# Patient Record
Sex: Male | Born: 1965 | Race: White | Hispanic: No | Marital: Married | Smoking: Former smoker
Health system: Southern US, Community
[De-identification: ages and names within clinical notes are randomized; demographics above are authoritative.]

## PROBLEM LIST (undated history)

## (undated) DIAGNOSIS — K219 Gastro-esophageal reflux disease without esophagitis: Secondary | ICD-10-CM

## (undated) HISTORY — PX: NECK SURGERY: SHX720

---

## 1999-04-30 ENCOUNTER — Emergency Department (HOSPITAL_COMMUNITY): Admission: EM | Admit: 1999-04-30 | Discharge: 1999-04-30 | Payer: Self-pay | Admitting: Emergency Medicine

## 1999-05-05 ENCOUNTER — Emergency Department (HOSPITAL_COMMUNITY): Admission: EM | Admit: 1999-05-05 | Discharge: 1999-05-05 | Payer: Self-pay | Admitting: Emergency Medicine

## 1999-05-10 ENCOUNTER — Encounter: Admission: RE | Admit: 1999-05-10 | Discharge: 1999-05-10 | Payer: Self-pay | Admitting: *Deleted

## 1999-06-17 ENCOUNTER — Emergency Department (HOSPITAL_COMMUNITY): Admission: EM | Admit: 1999-06-17 | Discharge: 1999-06-17 | Payer: Self-pay | Admitting: Emergency Medicine

## 1999-06-17 ENCOUNTER — Encounter: Payer: Self-pay | Admitting: Emergency Medicine

## 2005-07-06 ENCOUNTER — Ambulatory Visit: Payer: Self-pay | Admitting: Internal Medicine

## 2005-07-20 ENCOUNTER — Observation Stay (HOSPITAL_COMMUNITY): Admission: RE | Admit: 2005-07-20 | Discharge: 2005-07-21 | Payer: Self-pay | Admitting: Neurosurgery

## 2005-10-02 ENCOUNTER — Encounter: Admission: RE | Admit: 2005-10-02 | Discharge: 2005-10-02 | Payer: Self-pay | Admitting: Neurosurgery

## 2006-06-23 ENCOUNTER — Emergency Department (HOSPITAL_COMMUNITY): Admission: EM | Admit: 2006-06-23 | Discharge: 2006-06-23 | Payer: Self-pay | Admitting: Emergency Medicine

## 2015-01-04 ENCOUNTER — Encounter (HOSPITAL_BASED_OUTPATIENT_CLINIC_OR_DEPARTMENT_OTHER): Payer: Self-pay | Admitting: *Deleted

## 2015-01-04 ENCOUNTER — Emergency Department (HOSPITAL_BASED_OUTPATIENT_CLINIC_OR_DEPARTMENT_OTHER)
Admission: EM | Admit: 2015-01-04 | Discharge: 2015-01-04 | Disposition: A | Discharge status: Home / Self Care | Payer: Self-pay | Attending: Emergency Medicine | Admitting: Emergency Medicine

## 2015-01-04 DIAGNOSIS — Z202 Contact with and (suspected) exposure to infections with a predominantly sexual mode of transmission: Secondary | ICD-10-CM | POA: Insufficient documentation

## 2015-01-04 DIAGNOSIS — Z87891 Personal history of nicotine dependence: Secondary | ICD-10-CM | POA: Insufficient documentation

## 2015-01-04 LAB — URINALYSIS, ROUTINE W REFLEX MICROSCOPIC
Bilirubin Urine: NEGATIVE
Glucose, UA: NEGATIVE mg/dL
Hgb urine dipstick: NEGATIVE
Ketones, ur: NEGATIVE mg/dL
Leukocytes, UA: NEGATIVE
Nitrite: NEGATIVE
Protein, ur: NEGATIVE mg/dL
Specific Gravity, Urine: 1.024 (ref 1.005–1.030)
Urobilinogen, UA: 1 mg/dL (ref 0.0–1.0)
pH: 6.5 (ref 5.0–8.0)

## 2015-01-04 MED ORDER — CEFTRIAXONE SODIUM 250 MG IJ SOLR
250.0000 mg | Freq: Once | INTRAMUSCULAR | Status: AC
  Administered 2015-01-04: 250 mg via INTRAMUSCULAR
  Filled 2015-01-04: qty 250

## 2015-01-04 MED ORDER — AZITHROMYCIN 250 MG PO TABS
1000.0000 mg | ORAL_TABLET | Freq: Once | ORAL | Status: AC
  Administered 2015-01-04: 1000 mg via ORAL
  Filled 2015-01-04: qty 4

## 2015-01-04 MED ORDER — LIDOCAINE HCL (PF) 1 % IJ SOLN
INTRAMUSCULAR | Status: AC
  Filled 2015-01-04: qty 5

## 2015-01-04 NOTE — Discharge Instructions (Signed)
Please read and follow all provided instructions.  Your diagnoses today include:  1. Exposure to STD     Tests performed today include:  Test for gonorrhea, chlamydia, HIV, and syphilis. You will be notified by telephone if you have a positive result.  Vital signs. See below for your results today.   Medications:  You were treated for chlamydia (1 gram azithromycin pills) and gonorrhea (250mg  rocephin shot).  Home care instructions:  Read educational materials contained in this packet and follow any instructions provided.   You should tell your partners about your infection and avoid having sex for one week to allow time for the medicine to work.  STD Testing:  [facility] [place] [place]  [address], [place], phone [phone] or [phone]    Monday - Friday, call for an appointment  [facility] [place] [place] [facility]  [address], [place], phone [phone] or [phone]   Monday - Friday, call for an appointment  Return instructions:   Please return to the Emergency Department if you experience worsening symptoms.   Please return if you have any other emergent concerns.  Additional Information:  Your vital signs today were: BP 119/78 mmHg   Pulse 75   Temp(Src) 98.2 F (36.8 C) (Oral)   Resp 18   Ht 5\' 10"  (1.778 m)   Wt 210 lb (95.255 kg)   BMI 30.13 kg/m2   SpO2 99% If your blood pressure (BP) was elevated above 135/85 this visit, please have this repeated by your doctor within one month. --------------

## 2015-01-04 NOTE — ED Provider Notes (Signed)
CSN: [id number]     Arrival date & time 01/04/15  1937 History   First MD Initiated Contact with Patient 01/04/15 2142     Chief Complaint  Patient presents with  . Exposure to STD     (Consider location/radiation/quality/duration/timing/severity/associated sxs/prior Treatment) HPI Comments: Patient presents with complaint of exposure to sexually transmitted infection. Patient states that his wife was told that she tested positive for sexual transmitted infection today. He is uncertain as to the infection that she has. She was treated with pills. He denies any symptoms including penile discharge, dysuria, sores. He is here for testing and treatment. No other complaints. Patient has not had any other sexual partners in 4 years.   The history is provided by the patient.    History reviewed. No pertinent past medical history. Past Surgical History  Procedure Laterality Date  . Neck surgery     No family history on file. History  Substance Use Topics  . Smoking status: Former Games developer  . Smokeless tobacco: Not on file  . Alcohol Use: No    Review of Systems  Constitutional: Negative for fever.  HENT: Negative for sore throat.   Eyes: Negative for discharge.  Gastrointestinal: Negative for rectal pain.  Genitourinary: Negative for dysuria, frequency, discharge, genital sores, penile pain and testicular pain.  Musculoskeletal: Negative for arthralgias.  Skin: Negative for rash.  Hematological: Negative for adenopathy.      Allergies  Review of patient's allergies indicates no known allergies.  Home Medications   Prior to Admission medications   Not on File   BP 119/78 mmHg  Pulse 75  Temp(Src) 98.2 F (36.8 C) (Oral)  Resp 18  Ht  (1.778 m)  Wt 210 lb (95.255 kg)  BMI 30.13 kg/m2  SpO2 99% Physical Exam  Constitutional: He appears well-developed and well-nourished.  HENT:  Head: Normocephalic and atraumatic.  Eyes: Conjunctivae are normal.  Neck: Normal  range of motion. Neck supple.  Pulmonary/Chest: No respiratory distress.  Genitourinary:    Right testis shows no mass, no swelling and no tenderness. Left testis shows no mass, no swelling and no tenderness. Uncircumcised.  Lymphadenopathy:       Right: No inguinal adenopathy present.       Left: No inguinal adenopathy present.  Neurological: He is alert.  Skin: Skin is warm and dry.  Psychiatric: He has a normal mood and affect.  Nursing note and vitals reviewed.   ED Course  Procedures (including critical care time) Labs Review Labs Reviewed  URINALYSIS, ROUTINE W REFLEX MICROSCOPIC  RPR  HIV ANTIBODY (ROUTINE TESTING)  GC/CHLAMYDIA PROBE AMP ([facility])    Imaging Review No results found.   EKG Interpretation None      10:54 PM Patient seen and examined. Medications ordered.   Vital signs reviewed and are as follows: BP 119/78 mmHg  Pulse 75  Temp(Src) 98.2 F (36.8 C) (Oral)  Resp 18  Ht  (1.778 m)  Wt 210 lb (95.255 kg)  BMI 30.13 kg/m2  SpO2 99%   Pt seen and examined. Will test and treat for STD exposure. Patient counseled on safe sexual practices. Told them that they should not have sexual contact for next 7 days and that they need to inform sexual partners so that they can get tested and treated as well. Patient verbalizes understanding and agrees with plan.      MDM   Final diagnoses:  Exposure to STD   Tested and treated. UA nml.  Renne CriglerJoshua Morrill Bomkamp, PA-C 01/04/15 2312  Arby BarretteMarcy Pfeiffer, MD 01/10/15 [phone]

## 2015-01-04 NOTE — ED Notes (Signed)
States his wife was treated for an STD last week and he is here to get treatment also.

## 2015-01-06 LAB — RPR: RPR Ser Ql: NONREACTIVE

## 2015-01-06 LAB — HIV ANTIBODY (ROUTINE TESTING W REFLEX): HIV SCREEN 4TH GENERATION: NONREACTIVE

## 2016-03-31 ENCOUNTER — Observation Stay (HOSPITAL_COMMUNITY): Payer: Self-pay | Admitting: Registered Nurse

## 2016-03-31 ENCOUNTER — Encounter (HOSPITAL_BASED_OUTPATIENT_CLINIC_OR_DEPARTMENT_OTHER): Payer: Self-pay | Admitting: *Deleted

## 2016-03-31 ENCOUNTER — Observation Stay (HOSPITAL_COMMUNITY): Payer: Self-pay

## 2016-03-31 ENCOUNTER — Encounter (HOSPITAL_COMMUNITY)
Admission: EM | Disposition: A | Discharge status: Home / Self Care | Payer: Self-pay | Source: Home / Self Care | Attending: Emergency Medicine

## 2016-03-31 ENCOUNTER — Observation Stay (HOSPITAL_BASED_OUTPATIENT_CLINIC_OR_DEPARTMENT_OTHER)
Admission: EM | Admit: 2016-03-31 | Discharge: 2016-04-02 | Disposition: A | Discharge status: Home / Self Care | Payer: Self-pay | Attending: General Surgery | Admitting: General Surgery

## 2016-03-31 DIAGNOSIS — L02416 Cutaneous abscess of left lower limb: Principal | ICD-10-CM | POA: Insufficient documentation

## 2016-03-31 DIAGNOSIS — B356 Tinea cruris: Secondary | ICD-10-CM | POA: Insufficient documentation

## 2016-03-31 DIAGNOSIS — L0291 Cutaneous abscess, unspecified: Secondary | ICD-10-CM

## 2016-03-31 DIAGNOSIS — L03116 Cellulitis of left lower limb: Secondary | ICD-10-CM | POA: Insufficient documentation

## 2016-03-31 DIAGNOSIS — L02214 Cutaneous abscess of groin: Secondary | ICD-10-CM | POA: Diagnosis present

## 2016-03-31 DIAGNOSIS — L0231 Cutaneous abscess of buttock: Secondary | ICD-10-CM

## 2016-03-31 DIAGNOSIS — Z6831 Body mass index (BMI) 31.0-31.9, adult: Secondary | ICD-10-CM | POA: Insufficient documentation

## 2016-03-31 DIAGNOSIS — E669 Obesity, unspecified: Secondary | ICD-10-CM | POA: Insufficient documentation

## 2016-03-31 DIAGNOSIS — F17290 Nicotine dependence, other tobacco product, uncomplicated: Secondary | ICD-10-CM | POA: Insufficient documentation

## 2016-03-31 HISTORY — PX: INCISION AND DRAINAGE ABSCESS: SHX5864

## 2016-03-31 LAB — CBC WITH DIFFERENTIAL/PLATELET
BASOS PCT: 0 %
BLASTS: 0 %
Band Neutrophils: 0 %
Basophils Absolute: 0 10*3/uL (ref 0.0–0.1)
Eosinophils Absolute: 0.1 10*3/uL (ref 0.0–0.7)
Eosinophils Relative: 1 %
HEMATOCRIT: 43.1 % (ref 39.0–52.0)
HEMOGLOBIN: 14.5 g/dL (ref 13.0–17.0)
Lymphocytes Relative: 34 %
Lymphs Abs: 3.9 10*3/uL (ref 0.7–4.0)
MCH: 29.5 pg (ref 26.0–34.0)
MCHC: 33.6 g/dL (ref 30.0–36.0)
MCV: 87.8 fL (ref 78.0–100.0)
Metamyelocytes Relative: 0 %
Monocytes Absolute: 0.9 10*3/uL (ref 0.1–1.0)
Monocytes Relative: 8 %
Myelocytes: 0 %
NEUTROS PCT: 57 %
NRBC: 0 /100{WBCs}
Neutro Abs: 6.7 10*3/uL (ref 1.7–7.7)
OTHER: 0 %
PLATELETS: 486 10*3/uL — AB (ref 150–400)
PROMYELOCYTES ABS: 0 %
RBC: 4.91 MIL/uL (ref 4.22–5.81)
RDW: 14.5 % (ref 11.5–15.5)
WBC: 11.6 10*3/uL — ABNORMAL HIGH (ref 4.0–10.5)

## 2016-03-31 LAB — BASIC METABOLIC PANEL
Anion gap: 5 (ref 5–15)
BUN: 15 mg/dL (ref 6–20)
CHLORIDE: 106 mmol/L (ref 101–111)
CO2: 26 mmol/L (ref 22–32)
Calcium: 8.5 mg/dL — ABNORMAL LOW (ref 8.9–10.3)
Creatinine, Ser: 1.13 mg/dL (ref 0.61–1.24)
GFR calc Af Amer: >60 mL/min (ref >60)
Glucose, Bld: 74 mg/dL (ref 65–99)
Potassium: 3.8 mmol/L (ref 3.5–5.1)
SODIUM: 137 mmol/L (ref 135–145)

## 2016-03-31 LAB — I-STAT CG4 LACTIC ACID, ED: LACTIC ACID, VENOUS: 1.34 mmol/L (ref 0.5–2.0)

## 2016-03-31 LAB — PROTIME-INR
INR: 0.99 (ref 0.00–1.49)
PROTHROMBIN TIME: 13.3 s (ref 11.6–15.2)

## 2016-03-31 LAB — MRSA PCR SCREENING: MRSA BY PCR: POSITIVE — AB

## 2016-03-31 SURGERY — INCISION AND DRAINAGE, ABSCESS
Anesthesia: General | Site: Thigh | Laterality: Left

## 2016-03-31 MED ORDER — HYDROMORPHONE HCL 1 MG/ML IJ SOLN: 1.0000 mg | INTRAMUSCULAR | Status: DC | PRN

## 2016-03-31 MED ORDER — FENTANYL CITRATE (PF) 100 MCG/2ML IJ SOLN
INTRAMUSCULAR | Status: AC
  Filled 2016-03-31: qty 2

## 2016-03-31 MED ORDER — FENTANYL CITRATE (PF) 100 MCG/2ML IJ SOLN
INTRAMUSCULAR | Status: DC | PRN
  Administered 2016-03-31 (×4): 50 ug via INTRAVENOUS

## 2016-03-31 MED ORDER — MIDAZOLAM HCL 5 MG/5ML IJ SOLN
INTRAMUSCULAR | Status: DC | PRN
  Administered 2016-03-31: 2 mg via INTRAVENOUS

## 2016-03-31 MED ORDER — PIPERACILLIN-TAZOBACTAM 3.375 G IVPB
3.3750 g | Freq: Three times a day (TID) | INTRAVENOUS | Status: DC
  Administered 2016-03-31 – 2016-04-02 (×6): 3.375 g via INTRAVENOUS
  Filled 2016-03-31 (×7): qty 50

## 2016-03-31 MED ORDER — LIDOCAINE HCL (CARDIAC) 20 MG/ML IV SOLN
INTRAVENOUS | Status: DC | PRN
  Administered 2016-03-31: 30 mg via INTRAVENOUS

## 2016-03-31 MED ORDER — ACETAMINOPHEN 650 MG RE SUPP: 650.0000 mg | Freq: Four times a day (QID) | RECTAL | Status: DC | PRN

## 2016-03-31 MED ORDER — IOPAMIDOL (ISOVUE-300) INJECTION 61%
100.0000 mL | Freq: Once | INTRAVENOUS | Status: AC | PRN
  Administered 2016-03-31: 100 mL via INTRAVENOUS

## 2016-03-31 MED ORDER — OXYCODONE HCL 5 MG PO TABS: 5.0000 mg | ORAL_TABLET | Freq: Once | ORAL | Status: DC | PRN

## 2016-03-31 MED ORDER — ONDANSETRON HCL 4 MG/2ML IJ SOLN
INTRAMUSCULAR | Status: DC | PRN
  Administered 2016-03-31: 4 mg via INTRAVENOUS

## 2016-03-31 MED ORDER — MIDAZOLAM HCL 2 MG/2ML IJ SOLN
INTRAMUSCULAR | Status: AC
  Filled 2016-03-31: qty 2

## 2016-03-31 MED ORDER — BUPIVACAINE-EPINEPHRINE (PF) 0.25% -1:200000 IJ SOLN
INTRAMUSCULAR | Status: AC
  Filled 2016-03-31: qty 30

## 2016-03-31 MED ORDER — MORPHINE SULFATE (PF) 2 MG/ML IV SOLN
1.0000 mg | INTRAVENOUS | Status: DC | PRN
  Administered 2016-04-01: 2 mg via INTRAVENOUS
  Filled 2016-03-31: qty 1

## 2016-03-31 MED ORDER — DEXAMETHASONE SODIUM PHOSPHATE 10 MG/ML IJ SOLN
INTRAMUSCULAR | Status: DC | PRN
  Administered 2016-03-31: 10 mg via INTRAVENOUS

## 2016-03-31 MED ORDER — LIDOCAINE HCL (CARDIAC) 20 MG/ML IV SOLN
INTRAVENOUS | Status: AC
  Filled 2016-03-31: qty 5

## 2016-03-31 MED ORDER — DEXAMETHASONE SODIUM PHOSPHATE 10 MG/ML IJ SOLN
INTRAMUSCULAR | Status: AC
  Filled 2016-03-31: qty 1

## 2016-03-31 MED ORDER — ONDANSETRON 4 MG PO TBDP: 4.0000 mg | ORAL_TABLET | Freq: Four times a day (QID) | ORAL | Status: DC | PRN

## 2016-03-31 MED ORDER — ACETAMINOPHEN 325 MG PO TABS: 650.0000 mg | ORAL_TABLET | Freq: Four times a day (QID) | ORAL | Status: DC | PRN

## 2016-03-31 MED ORDER — ONDANSETRON HCL 4 MG/2ML IJ SOLN
INTRAMUSCULAR | Status: AC
  Filled 2016-03-31: qty 2

## 2016-03-31 MED ORDER — VANCOMYCIN HCL IN DEXTROSE 1-5 GM/200ML-% IV SOLN
1000.0000 mg | Freq: Three times a day (TID) | INTRAVENOUS | Status: DC
  Administered 2016-03-31 – 2016-04-02 (×6): 1000 mg via INTRAVENOUS
  Filled 2016-03-31 (×7): qty 200

## 2016-03-31 MED ORDER — SODIUM CHLORIDE 0.9 % IV SOLN
INTRAVENOUS | Status: DC
  Administered 2016-03-31 – 2016-04-01 (×2): via INTRAVENOUS

## 2016-03-31 MED ORDER — PROPOFOL 10 MG/ML IV BOLUS
INTRAVENOUS | Status: DC | PRN
  Administered 2016-03-31: 180 mg via INTRAVENOUS

## 2016-03-31 MED ORDER — HYDROMORPHONE HCL 1 MG/ML IJ SOLN
INTRAMUSCULAR | Status: AC
  Filled 2016-03-31: qty 1

## 2016-03-31 MED ORDER — HYDROMORPHONE HCL 1 MG/ML IJ SOLN
0.2500 mg | INTRAMUSCULAR | Status: DC | PRN
  Administered 2016-03-31 (×4): 0.5 mg via INTRAVENOUS

## 2016-03-31 MED ORDER — ENOXAPARIN SODIUM 40 MG/0.4ML ~~LOC~~ SOLN
40.0000 mg | SUBCUTANEOUS | Status: DC
  Administered 2016-04-01: 40 mg via SUBCUTANEOUS
  Filled 2016-03-31 (×3): qty 0.4

## 2016-03-31 MED ORDER — OXYCODONE-ACETAMINOPHEN 5-325 MG PO TABS
1.0000 | ORAL_TABLET | ORAL | Status: DC | PRN
Start: 2016-03-31 — End: 2016-04-02

## 2016-03-31 MED ORDER — ONDANSETRON HCL 4 MG/2ML IJ SOLN: 4.0000 mg | Freq: Four times a day (QID) | INTRAMUSCULAR | Status: DC | PRN

## 2016-03-31 MED ORDER — OXYCODONE HCL 5 MG/5ML PO SOLN: 5.0000 mg | Freq: Once | ORAL | Status: DC | PRN

## 2016-03-31 MED ORDER — PROPOFOL 10 MG/ML IV BOLUS
INTRAVENOUS | Status: AC
  Filled 2016-03-31: qty 20

## 2016-03-31 MED ORDER — 0.9 % SODIUM CHLORIDE (POUR BTL) OPTIME
TOPICAL | Status: DC | PRN
  Administered 2016-03-31: 1000 mL

## 2016-03-31 SURGICAL SUPPLY — 33 items
BLADE SURG 15 STRL LF DISP TIS (BLADE) ×1 IMPLANT
BLADE SURG 15 STRL SS (BLADE) ×3
BNDG GAUZE ELAST 4 BULKY (GAUZE/BANDAGES/DRESSINGS) ×4 IMPLANT
COVER SURGICAL LIGHT HANDLE (MISCELLANEOUS) ×6 IMPLANT
DECANTER SPIKE VIAL GLASS SM (MISCELLANEOUS) IMPLANT
DRAPE LAPAROSCOPIC ABDOMINAL (DRAPES) IMPLANT
DRSG PAD ABDOMINAL 8X10 ST (GAUZE/BANDAGES/DRESSINGS) ×4 IMPLANT
ELECT PENCIL ROCKER SW 15FT (MISCELLANEOUS) ×3 IMPLANT
ELECT REM PT RETURN 9FT ADLT (ELECTROSURGICAL) ×3
ELECTRODE REM PT RTRN 9FT ADLT (ELECTROSURGICAL) ×1 IMPLANT
GAUZE SPONGE 4X4 12PLY STRL (GAUZE/BANDAGES/DRESSINGS) ×2 IMPLANT
GLOVE BIO SURGEON STRL SZ7.5 (GLOVE) ×3 IMPLANT
GLOVE BIOGEL PI IND STRL 7.0 (GLOVE) IMPLANT
GLOVE BIOGEL PI IND STRL 7.5 (GLOVE) IMPLANT
GLOVE BIOGEL PI INDICATOR 7.0 (GLOVE) ×2
GLOVE BIOGEL PI INDICATOR 7.5 (GLOVE) ×2
GLOVE SURG SS PI 7.0 STRL IVOR (GLOVE) ×2 IMPLANT
GOWN STRL REUS W/TWL LRG LVL3 (GOWN DISPOSABLE) ×6 IMPLANT
KIT BASIN OR (CUSTOM PROCEDURE TRAY) ×3 IMPLANT
NDL HYPO 25X1 1.5 SAFETY (NEEDLE) IMPLANT
NEEDLE HYPO 25X1 1.5 SAFETY (NEEDLE) IMPLANT
NS IRRIG 1000ML POUR BTL (IV SOLUTION) ×3 IMPLANT
SPONGE LAP 18X18 X RAY DECT (DISPOSABLE) ×1 IMPLANT
SUT MNCRL AB 4-0 PS2 18 (SUTURE) IMPLANT
SUT VIC AB 3-0 SH 27 (SUTURE)
SUT VIC AB 3-0 SH 27XBRD (SUTURE) IMPLANT
SWAB COLLECTION DEVICE MRSA (MISCELLANEOUS) ×2 IMPLANT
SWAB CULTURE ESWAB REG 1ML (MISCELLANEOUS) ×2 IMPLANT
SYR BULB 3OZ (MISCELLANEOUS) ×1 IMPLANT
SYR CONTROL 10ML LL (SYRINGE) IMPLANT
TAPE CLOTH SURG 4X10 WHT LF (GAUZE/BANDAGES/DRESSINGS) ×2 IMPLANT
TOWEL OR 17X26 10 PK STRL BLUE (TOWEL DISPOSABLE) ×3 IMPLANT
YANKAUER SUCT BULB TIP NO VENT (SUCTIONS) ×3 IMPLANT

## 2016-03-31 NOTE — Progress Notes (Signed)
Pharmacy Antibiotic Note  Silvestre GunnerDouglas L Schillo is a 50 y.o. male admitted on 03/31/2016 with cellulitis/abscess of groin region for past week that has worsened.  Pharmacy has been consulted for Vancomycin dosing.  03/31/2016:   Afebrile  Mild leukocytosis; LA wnl  Renal fxn at baseline.  NCrCl~280ml/min.  Plan:  Vancomycin 1 IV every 8 hours.  Goal trough 15-20 mcg/mL.   Check Vancomycin trough at steady state  Zosyn per MD  Monitor renal function and cx data   Height: 5\' 9"  (175.3 cm) Weight: 210 lb (95.255 kg) IBW/kg (Calculated) : 70.7  Temp (24hrs), Avg:98.1 F (36.7 C), Min:97.9 F (36.6 C), Max:98.4 F (36.9 C)   Recent Labs Lab 03/31/16 1220 03/31/16 1222  WBC 11.6*  --   CREATININE 1.13  --   LATICACIDVEN  --  1.34    Estimated Creatinine Clearance: 90 mL/min (by C-G formula based on Cr of 1.13).    No Known Allergies  Antimicrobials this admission: Vancomycin 4/28>> Zosyn 4/28 >>   Dose adjustments this admission:  Microbiology results: 4/28 BCx: sent 4/28 Wound: sent  Thank you for allowing pharmacy to be a part of this patient's care.  Junita PushMichelle Tammi Boulier, PharmD, BCPS Pager: [phone] 03/31/2016 2:13 PM

## 2016-03-31 NOTE — Anesthesia Preprocedure Evaluation (Signed)
Anesthesia Evaluation  Patient identified by MRN, date of birth, ID band Patient awake    Reviewed: Allergy & Precautions, NPO status , Patient's Chart, lab work & pertinent test results  Airway Mallampati: II   Neck ROM: full    Dental   Pulmonary former smoker,    breath sounds clear to auscultation       Cardiovascular negative cardio ROS   Rhythm:regular Rate:Normal     Neuro/Psych    GI/Hepatic   Endo/Other  obese  Renal/GU      Musculoskeletal   Abdominal   Peds  Hematology   Anesthesia Other Findings   Reproductive/Obstetrics                             Anesthesia Physical Anesthesia Plan  ASA: II  Anesthesia Plan: General   Post-op Pain Management:    Induction: Intravenous  Airway Management Planned: LMA  Additional Equipment:   Intra-op Plan:   Post-operative Plan:   Informed Consent: I have reviewed the patients History and Physical, chart, labs and discussed the procedure including the risks, benefits and alternatives for the proposed anesthesia with the patient or authorized representative who has indicated his/her understanding and acceptance.     Plan Discussed with: CRNA, Anesthesiologist and Surgeon  Anesthesia Plan Comments:         Anesthesia Quick Evaluation

## 2016-03-31 NOTE — Anesthesia Procedure Notes (Signed)
Procedure Name: LMA Insertion Date/Time: 03/31/2016 7:46 PM Performed by: Early OsmondEARGLE, Kaulana Brindle E Pre-anesthesia Checklist: Patient identified, Emergency Drugs available, Suction available and Patient being monitored Patient Re-evaluated:Patient Re-evaluated prior to inductionOxygen Delivery Method: Circle system utilized Preoxygenation: Pre-oxygenation with 100% oxygen Intubation Type: IV induction Ventilation: Mask ventilation without difficulty LMA: LMA inserted LMA Size: 5.0 Number of attempts: 1 Placement Confirmation: positive ETCO2 and breath sounds checked- equal and bilateral Tube secured with: Tape Dental Injury: Teeth and Oropharynx as per pre-operative assessment

## 2016-03-31 NOTE — Transfer of Care (Signed)
Immediate Anesthesia Transfer of Care Note  Patient: Alan Goodman  Procedure(s) Performed: Procedure(s): INCISION AND DRAINAGE OF LEFT GROIN ABSCESS (Left)  Patient Location: PACU  Anesthesia Type:General  Level of Consciousness:  sedated, patient cooperative and responds to stimulation  Airway & Oxygen Therapy:Patient Spontanous Breathing and Patient connected to face mask oxgen  Post-op Assessment:  Report given to PACU RN and Post -op Vital signs reviewed and stable  Post vital signs:  Reviewed and stable  Last Vitals:  Filed Vitals:   03/31/16 1515 03/31/16 1641  BP: 106/56 110/62  Pulse: 68 72  Temp: 36.7 C 36.7 C  Resp: 16 20    Complications: No apparent anesthesia complications

## 2016-03-31 NOTE — Progress Notes (Signed)
  Left groin/thigh abscess, 6cm.   Recommend OR for I&D this evening.  Surgical risks discussed with the patient including infection, bleeding, anesthesia risks.  He verbalizes understanding and wishes to proceed.  Geneviene Tesch, ANP-BC

## 2016-03-31 NOTE — H&P (Signed)
Chief Complaint: left thigh abscess and malaise HPI: Alan Goodman is a 50 year old male who presents from [facility] with a left groin abscess.  He reports developing malaise and generalized fatigue about 1 week ago and thereafter groin pain.  About 3 days ago, he noticed purulent drainage which spontaneously started draining.  He has not been able to inspect the area and doesn't know if it has changed or not.  Denies previous symptoms.  He is a former smoker, but still "vapes."  Denies diabetes.  His work up shows, WBC 11.6.  He was unable to get a CT scan at [facility] as it was not working and therefore referred to [facility].   History reviewed. No pertinent past medical history.  Past Surgical History  Procedure Laterality Date  . Neck surgery      History reviewed. No pertinent family history. Social History:  reports that he has quit smoking. He does not have any smokeless tobacco history on file. He reports that he does not drink alcohol or use illicit drugs.  Allergies: No Known Allergies   (Not in a hospital admission)  Results for orders placed or performed during the hospital encounter of 03/31/16 (from the past 48 hour(s))  CBC with Differential/Platelet     Status: Abnormal   Collection Time: 03/31/16 12:20 PM  Result Value Ref Range   WBC 11.6 (H) 4.0 - 10.5 K/uL   RBC 4.91 4.22 - 5.81 MIL/uL   Hemoglobin 14.5 13.0 - 17.0 g/dL   HCT 43.1 39.0 - 52.0 %   MCV 87.8 78.0 - 100.0 fL   MCH 29.5 26.0 - 34.0 pg   MCHC 33.6 30.0 - 36.0 g/dL   RDW 14.5 11.5 - 15.5 %   Platelets 486 (H) 150 - 400 K/uL   Neutrophils Relative % 57 %   Lymphocytes Relative 34 %   Monocytes Relative 8 %   Eosinophils Relative 1 %   Basophils Relative 0 %   Band Neutrophils 0 %   Metamyelocytes Relative 0 %   Myelocytes 0 %   Promyelocytes Absolute 0 %   Blasts 0 %   nRBC 0 0 /100 WBC   Other 0 %   Neutro Abs 6.7 1.7 - 7.7 K/uL   Lymphs Abs 3.9 0.7 - 4.0 K/uL   Monocytes Absolute 0.9 0.1 - 1.0 K/uL   Eosinophils Absolute 0.1 0.0 - 0.7 K/uL   Basophils Absolute 0.0 0.0 - 0.1 K/uL   WBC Morphology TOXIC GRANULATION     Comment: ATYPICAL LYMPHOCYTES  Basic metabolic panel     Status: Abnormal   Collection Time: 03/31/16 12:20 PM  Result Value Ref Range   Sodium 137 135 - 145 mmol/L   Potassium 3.8 3.5 - 5.1 mmol/L   Chloride 106 101 - 111 mmol/L   CO2 26 22 - 32 mmol/L   Glucose, Bld 74 65 - 99 mg/dL   BUN 15 6 - 20 mg/dL   Creatinine, Ser 1.13 0.61 - 1.24 mg/dL   Calcium 8.5 (L) 8.9 - 10.3 mg/dL   GFR calc non Af Amer >60 >60 mL/min   GFR calc Af Amer >60 >60 mL/min    Comment: (NOTE) The eGFR has been calculated using the CKD EPI equation. This calculation has not been validated in all clinical situations. eGFR's persistently <60 mL/min signify possible Chronic Kidney Disease.    Anion gap 5 5 - 15  Protime-INR     Status: None   Collection Time: 03/31/16 12:20  PM  Result Value Ref Range   Prothrombin Time 13.3 11.6 - 15.2 seconds   INR 0.99 0.00 - 1.49  I-Stat CG4 Lactic Acid, ED     Status: None   Collection Time: 03/31/16 12:22 PM  Result Value Ref Range   Lactic Acid, Venous 1.34 0.5 - 2.0 mmol/L   No results found.  Review of Systems  Constitutional: Positive for fever, chills and malaise/fatigue. Negative for weight loss and diaphoresis.  Eyes: Negative for blurred vision, double vision, photophobia, pain, discharge and redness.  Respiratory: Negative for cough, hemoptysis, sputum production, shortness of breath and wheezing.   Cardiovascular: Negative for chest pain, palpitations, orthopnea, claudication, leg swelling and PND.  Gastrointestinal: Negative for heartburn, nausea, vomiting, abdominal pain, diarrhea, constipation, blood in stool and melena.  Genitourinary: Negative for dysuria, urgency, frequency, hematuria and flank pain.  Musculoskeletal: Negative for myalgias, back pain, joint pain, falls and neck pain.  Neurological: Positive for weakness.  Negative for dizziness, tingling, tremors, sensory change, speech change, focal weakness, seizures and loss of consciousness.  Psychiatric/Behavioral: Negative for depression, suicidal ideas, hallucinations, memory loss and substance abuse. The patient is not nervous/anxious and does not have insomnia.     Blood pressure 114/83, pulse 86, temperature 98 F (36.7 C), temperature source Oral, resp. rate 18, height '5\' 9"'$  (1.753 m), weight 95.255 kg (210 lb), SpO2 99 %. Physical Exam  Constitutional: He is oriented to person, place, and time. He appears well-developed and well-nourished. No distress.  HENT:  Head: Normocephalic.  Cardiovascular: Normal rate, regular rhythm, normal heart sounds and intact distal pulses.  Exam reveals no gallop and no friction rub.   No murmur heard. Respiratory: Effort normal and breath sounds normal. No respiratory distress. He has no wheezes. He has no rales. He exhibits no tenderness.  GI: Soft. Bowel sounds are normal. He exhibits no distension and no mass. There is no tenderness. There is no rebound and no guarding.  Neurological: He is alert and oriented to person, place, and time.  Skin: He is not diaphoretic.  Left groin erythema and induration, draining abscess, tenderness.   Psychiatric: He has a normal mood and affect. His behavior is normal. Judgment and thought content normal.     Assessment/Plan Left groin abscess and cellulitis-it appears to be draining, but has erythema and induration.  Will proceed with a CT scan to further evaluate for pockets that may not be well drained.  Leave NPO for now in case surgical intervention is indicated based on the CT.  Start Vancomycin and zosyn.   Dispo-CT, admit to floor  Erby Pian, NP 03/31/2016, 2:11 PM

## 2016-03-31 NOTE — Discharge Instructions (Signed)
[facility]

## 2016-03-31 NOTE — ED Provider Notes (Signed)
CSN: [id number]     Arrival date & time 03/31/16  1058 History   First MD Initiated Contact with Patient 03/31/16 1131     Chief Complaint  Patient presents with  . Abscess     (Consider location/radiation/quality/duration/timing/severity/associated sxs/prior Treatment) HPI  This is a 50 year old male who presents the emergency department with chief complaint of abscess. The patient is a long-distance Naval architecttruck driver. He states that he developed an abscess on his left inferior gluteal region about one week ago. Patient states that it opened and drained. He felt a "knot in his left inguinal region and states that he pushed on it and a large "glob" of pus came out from much further away. He states it has been complaining persistently since that time and his groin has become very wet. He has developed a thick rash around the scrotum and groin region. He states that he feels acute developing a abscess on the other cheek. He denies fevers or chills. He denies a history of diabetes. History reviewed. No pertinent past medical history. Past Surgical History  Procedure Laterality Date  . Neck surgery     History reviewed. No pertinent family history. Social History  Substance Use Topics  . Smoking status: Former Games developermoker  . Smokeless tobacco: None  . Alcohol Use: No    Review of Systems Ten systems reviewed and are negative for acute change, except as noted in the HPI.     Allergies  Review of patient's allergies indicates no known allergies.  Home Medications   Prior to Admission medications   Not on File   BP 109/73 mmHg  Pulse 77  Temp(Src) 97.9 F (36.6 C) (Oral)  Resp 18  Ht 5\' 9"  (1.753 m)  Wt 95.255 kg  BMI 31.00 kg/m2  SpO2 99% Physical Exam  Constitutional: He appears well-developed and well-nourished. No distress.  HENT:  Head: Normocephalic and atraumatic.  Eyes: Conjunctivae are normal. No scleral icterus.  Neck: Normal range of motion. Neck supple.   Cardiovascular: Normal rate, regular rhythm and normal heart sounds.   Pulmonary/Chest: Effort normal and breath sounds normal. No respiratory distress.  Abdominal: Soft. There is no tenderness.  Genitourinary:  4 cm opening on the left gluteal region. Tissue is exquisitely tender and indurated. Tissue is erythematous and macerated in the BL inguinal region with well demarcated borders. NO scrotal induration or tenderness.  No Verrucous lesion on the left side of the corona  Musculoskeletal: He exhibits no edema.  Neurological: He is alert.  Skin: Skin is warm and dry. He is not diaphoretic.  Psychiatric: His behavior is normal.  Nursing note and vitals reviewed.   ED Course  Procedures (including critical care time) Labs Review Labs Reviewed - No data to display  Imaging Review No results found. I have personally reviewed and evaluated these images and lab results as part of my medical decision-making.   EKG Interpretation None      MDM   Final diagnoses:  Abscess of buttock, left    Patient with suspcted deep and complex abscess of the buttock and groin.  I doubt Fournier's gangrene. [facility] Johna SheriffHoxworth. [facility] ob[facility] as the scanner is not working.  Elevated WBX count no hyperglycemia.  I feel the patient has an element of Tinea cruris.     Arthor Captainbigail Altheia Shafran, PA-C 03/31/16 1604  Arby BarretteMarcy Pfeiffer, MD 04/13/16 [phone]

## 2016-03-31 NOTE — ED Notes (Signed)
Surgeon at bedside.  

## 2016-03-31 NOTE — ED Notes (Signed)
Abscess on the back on left thigh.  Reports that it is draining and painful. Ambulatory.

## 2016-03-31 NOTE — Interval H&P Note (Signed)
History and Physical Interval Note:  03/31/2016 7:41 PM  Alan Goodman  has presented today for surgery, with the diagnosis of Incision and Drainage of Left Groin Abscess  The various methods of treatment have been discussed with the patient and family. After consideration of risks, benefits and other options for treatment, the patient has consented to  Procedure(s): INCISION AND DRAINAGE OF LEFT GROIN ABSCESS (Left) as a surgical intervention .  The patient's history has been reviewed, patient examined, no change in status, stable for surgery.  I have reviewed the patient's chart and labs.  Questions were answered to the patient's satisfaction.     TOTH III,PAUL S

## 2016-03-31 NOTE — ED Notes (Signed)
#  20g right AC started and secured for transport.  Labs pending Blood and wound cultures sent.   [facility]. Pt aware not to eat or drink anything from now on. Also aware to leave here and drive straight to the ED.

## 2016-03-31 NOTE — Op Note (Addendum)
03/31/2016  8:40 PM  PATIENT:  Alan Goodman  50 y.o. male  PRE-OPERATIVE DIAGNOSIS:  Incision and Drainage of Left Groin Abscess  POST-OPERATIVE DIAGNOSIS:  Incision and Drainage of Left Groin Abscess  PROCEDURE:  Procedure(s): INCISION AND DRAINAGE OF LEFT INNER THIGH ABSCESS (Left)  SURGEON:  Surgeon(s) and Role:    * Griselda MinerPaul Toth III, MD - Primary  PHYSICIAN ASSISTANT:   ASSISTANTS: none   ANESTHESIA:   general  EBL:  Total I/O In: 600 [I.V.:600] Out: 100 [Blood:100]  BLOOD ADMINISTERED:none  DRAINS: none   LOCAL MEDICATIONS USED:  NONE  SPECIMEN:  No Specimen  DISPOSITION OF SPECIMEN:  N/A  COUNTS:  YES  TOURNIQUET:  * No tourniquets in log *  DICTATION: .Dragon Dictation   After informed consent was obtained the patient was brought to the operating room and placed in the supine position on the operating room table. After adequate induction of general anesthesia the patient was moved in the lithotomy position and all pressure points are padded. His perirectal area and inner thigh area was prepped with Betadine and draped in usual sterile manner. An appropriate timeout was performed. On the left inner thigh near the groin there was an open wound draining some purulent material. This opening was probed bluntly with finger and the abscess cavity was evacuated. The wound was then extended superiorly and inferiorly Through the skin and subcutaneous tissue so that the entire abscess cavity was open. Cultures were obtained. Hemostasis was achieved using the Bovie electrocautery. Once the entire abscess cavity was opened And all loculations were broken up Then the wound was packed with a Kerlix gauze. Sterile dressings were applied. The patient tolerated the procedure well. At the end of the case all needle sponge and instrument counts were correct. The patient was then awakened and taken to recovery in stable condition.  PLAN OF CARE: Admit to inpatient   PATIENT  DISPOSITION:  PACU - hemodynamically stable.   Delay start of Pharmacological VTE agent (>24hrs) due to surgical blood loss or risk of bleeding: no

## 2016-04-01 LAB — CBC
HCT: 39.3 % (ref 39.0–52.0)
HEMOGLOBIN: 12.8 g/dL — AB (ref 13.0–17.0)
MCH: 29 pg (ref 26.0–34.0)
MCHC: 32.6 g/dL (ref 30.0–36.0)
MCV: 89.1 fL (ref 78.0–100.0)
Platelets: 496 10*3/uL — ABNORMAL HIGH (ref 150–400)
RBC: 4.41 MIL/uL (ref 4.22–5.81)
RDW: 14.2 % (ref 11.5–15.5)
WBC: 13.7 10*3/uL — AB (ref 4.0–10.5)

## 2016-04-01 LAB — BASIC METABOLIC PANEL
ANION GAP: 9 (ref 5–15)
BUN: 11 mg/dL (ref 6–20)
CALCIUM: 8.6 mg/dL — AB (ref 8.9–10.3)
CHLORIDE: 105 mmol/L (ref 101–111)
CO2: 24 mmol/L (ref 22–32)
CREATININE: 1.11 mg/dL (ref 0.61–1.24)
GFR calc Af Amer: >60 mL/min (ref >60)
GFR calc non Af Amer: >60 mL/min (ref >60)
Glucose, Bld: 140 mg/dL — ABNORMAL HIGH (ref 65–99)
POTASSIUM: 4.6 mmol/L (ref 3.5–5.1)
SODIUM: 138 mmol/L (ref 135–145)

## 2016-04-01 MED ORDER — CHLORHEXIDINE GLUCONATE CLOTH 2 % EX PADS: 6.0000 | MEDICATED_PAD | Freq: Every day | CUTANEOUS | Status: DC

## 2016-04-01 MED ORDER — MUPIROCIN 2 % EX OINT
1.0000 "application " | TOPICAL_OINTMENT | Freq: Two times a day (BID) | CUTANEOUS | Status: DC
  Administered 2016-04-01 (×2): 1 via NASAL
  Filled 2016-04-01: qty 22

## 2016-04-01 NOTE — Progress Notes (Signed)
Patient ID: Alan Goodman, male   DOB: [birth year], 50 y.o.   MRN: [id number] 1 Day Post-Op  Subjective: Feels much better this morning. Dressing has been changed which he tolerated well.No complaints.  Objective: Vital signs in last 24 hours: Temp:  [97.6 F (36.4 C)-98.4 F (36.9 C)] 97.6 F (36.4 C) (04/29 0622) Pulse Rate:  [60-86] 60 (04/29 0622) Resp:  [14-20] 18 (04/29 0622) BP: (89-130)/(50-97) 92/59 mmHg (04/29 0622) SpO2:  [96 %-100 %] 97 % (04/29 0622) Weight:  [95.255 kg (210 lb)] 95.255 kg (210 lb) (04/28 1124) Last BM Date: 03/30/16  Intake/Output from previous day: 04/28 0701 - 04/29 0700 In: 800 [I.V.:800] Out: 825 [Urine:725; Blood:100] Intake/Output this shift: Total I/O In: 240 [P.O.:240] Out: -   General appearance: alert, cooperative and no distress Incision/Wound: wound packed and dressing clean. Marked reduction in erythema, induration and tendernesscompared to yesterday.  Lab Results:   Recent Labs  03/31/16 1220 04/01/16 0444  WBC 11.6* 13.7*  HGB 14.5 12.8*  HCT 43.1 39.3  PLT 486* 496*   BMET  Recent Labs  03/31/16 1220 04/01/16 0444  NA 137 138  K 3.8 4.6  CL 106 105  CO2 26 24  GLUCOSE 74 140*  BUN 15 11  CREATININE 1.13 1.11  CALCIUM 8.5* 8.6*   Results for orders placed or performed during the hospital encounter of 03/31/16  Wound culture     Status: None (Preliminary result)   Collection Time: 03/31/16 12:15 PM  Result Value Ref Range Status   Specimen Description ABSCESS  Final   Special Requests LEG LEFT PROXIMAL POSTERIOR THIGH  Final   Gram Stain   Final    ABUNDANT WBC PRESENT,BOTH PMN AND MONONUCLEAR NO SQUAMOUS EPITHELIAL CELLS SEEN MODERATE GRAM POSITIVE COCCI IN CLUSTERS RARE GRAM NEGATIVE RODS Performed at [facility]    Culture   Final    Culture reincubated for better growth Performed at [facility]    Report Status PENDING  Incomplete  Blood culture (routine x 2)     Status: None  (Preliminary result)   Collection Time: 03/31/16 12:20 PM  Result Value Ref Range Status   Specimen Description BLOOD RIGHT ANTECUBITAL  Final   Special Requests   Final    BOTTLES DRAWN AEROBIC AND ANAEROBIC 6CC Performed at [facility]    Culture PENDING  Incomplete   Report Status PENDING  Incomplete  Blood culture (routine x 2)     Status: None (Preliminary result)   Collection Time: 03/31/16 12:20 PM  Result Value Ref Range Status   Specimen Description BLOOD LEFT ANTECUBITAL  Final   Special Requests   Final    BOTTLES DRAWN AEROBIC AND ANAEROBIC 5 ML Performed at [facility]    Culture PENDING  Incomplete   Report Status PENDING  Incomplete  MRSA PCR Screening     Status: Abnormal   Collection Time: 03/31/16  5:50 PM  Result Value Ref Range Status   MRSA by PCR POSITIVE (A) NEGATIVE Final    Comment:        The GeneXpert MRSA Assay (FDA approved for NASAL specimens only), is one component of a comprehensive MRSA colonization surveillance program. It is not intended to diagnose MRSA infection nor to guide or monitor treatment for MRSA infections. RESULT CALLED TO, READ BACK BY AND VERIFIED WITH: Thyra Breed [id number] @ 1936 BY J SCOTTON      Studies/Results: Ct Pelvis W Contrast  03/31/2016  CLINICAL  DATA:  Left groin abscess.  Elevated white blood count. EXAM: CT PELVIS WITH CONTRAST TECHNIQUE: Multidetector CT imaging of the pelvis was performed using the standard protocol following the bolus administration of intravenous contrast. CONTRAST:  100mL ISOVUE-300 IOPAMIDOL (ISOVUE-300) INJECTION 61% COMPARISON:  None. FINDINGS: There is a 6.2 x 5.5 x 2.4 cm irregular abscess in the subcutaneous fat of the inner aspect of the proximal left thigh just lateral to the scrotum. This extends to the superficial fascia planes of muscles but does not extend into the muscles. Slight reactive adenopathy in the left groin. No other acute abnormality. Slight  degenerative changes of both hips. Slight facet arthritis at L4-5 bilaterally. The visualized bowel is normal.  Slight aortic atherosclerosis. IMPRESSION: Subcutaneous soft tissue abscess in the medial aspect of the proximal left thigh as described. No deep extension. Electronically Signed   By: Francene BoyersJames  Maxwell M.D.   On: 03/31/2016 15:12    Anti-infectives: Anti-infectives    Start     Dose/Rate Route Frequency Ordered Stop   03/31/16 1430  vancomycin (VANCOCIN) IVPB 1000 mg/200 mL premix     1,000 mg 200 mL/hr over 60 Minutes Intravenous Every 8 hours 03/31/16 1428     03/31/16 1415  piperacillin-tazobactam (ZOSYN) IVPB 3.375 g     3.375 g 12.5 mL/hr over 240 Minutes Intravenous Every 8 hours 03/31/16 1411        Assessment/Plan: s/p Procedure(s): INCISION AND DRAINAGE OF LEFT INNER THIGH ABSCESS Much improved after incision and drainage. Gram stain shows multiple organisms as above. Continue current antibiotic regimen. Continue wound care.Check CBC in a.m.        Leathia Farnell T 04/01/2016

## 2016-04-02 LAB — CBC
HCT: 38.3 % — ABNORMAL LOW (ref 39.0–52.0)
Hemoglobin: 12.5 g/dL — ABNORMAL LOW (ref 13.0–17.0)
MCH: 29.1 pg (ref 26.0–34.0)
MCHC: 32.6 g/dL (ref 30.0–36.0)
MCV: 89.1 fL (ref 78.0–100.0)
PLATELETS: 467 10*3/uL — AB (ref 150–400)
RBC: 4.3 MIL/uL (ref 4.22–5.81)
RDW: 14.5 % (ref 11.5–15.5)
WBC: 14.1 10*3/uL — AB (ref 4.0–10.5)

## 2016-04-02 LAB — BASIC METABOLIC PANEL
Anion gap: 9 (ref 5–15)
BUN: 12 mg/dL (ref 6–20)
CALCIUM: 8 mg/dL — AB (ref 8.9–10.3)
CO2: 25 mmol/L (ref 22–32)
CREATININE: 1.09 mg/dL (ref 0.61–1.24)
Chloride: 109 mmol/L (ref 101–111)
GFR calc non Af Amer: >60 mL/min (ref >60)
Glucose, Bld: 111 mg/dL — ABNORMAL HIGH (ref 65–99)
Potassium: 4 mmol/L (ref 3.5–5.1)
SODIUM: 143 mmol/L (ref 135–145)

## 2016-04-02 LAB — VANCOMYCIN, TROUGH: Vancomycin Tr: 27 ug/mL (ref 10.0–20.0)

## 2016-04-02 MED ORDER — VANCOMYCIN HCL IN DEXTROSE 750-5 MG/150ML-% IV SOLN: 750.0000 mg | Freq: Two times a day (BID) | INTRAVENOUS | Status: DC

## 2016-04-02 MED ORDER — OXYCODONE-ACETAMINOPHEN 5-325 MG PO TABS: 1.0000 | ORAL_TABLET | ORAL | Status: DC | PRN

## 2016-04-02 MED ORDER — DOXYCYCLINE HYCLATE 100 MG PO TABS: 100.0000 mg | ORAL_TABLET | Freq: Two times a day (BID) | ORAL | Status: DC

## 2016-04-02 NOTE — Progress Notes (Signed)
CRITICAL VALUE ALERT  Critical value received:  Vancomycin trough 27   Date of notification:  04/02/16  Time of notification:  0700  Critical value read back: yes  Nurse who received alert:  Christena DeemHannah Yifan Auker RN  MD notified (1st page):  Dr. Chevis PrettyPaul Toth  Time of first page:  0709  MD notified (2nd page):  Time of second page:  Responding MD:  Dr. Chevis PrettyPaul Toth  Time MD responded:  [phone]

## 2016-04-02 NOTE — Progress Notes (Signed)
Went over d/c instructions with patient and wife.  Both verbalized understanding.  Left hospital with hard script via w/c and personal vehicle. Levora AngelHannah V Solace Manwarren, RN

## 2016-04-02 NOTE — Progress Notes (Signed)
2 Days Post-Op  Subjective: No complaints. Tolerating dressing change without meds. Wants to go home  Objective: Vital signs in last 24 hours: Temp:  [97.9 F (36.6 C)-98.3 F (36.8 C)] 98 F (36.7 C) (04/30 0618) Pulse Rate:  [64-100] 64 (04/30 0618) Resp:  [16-18] 18 (04/30 0618) BP: (84-133)/(52-83) 104/60 mmHg (04/30 0618) SpO2:  [96 %-100 %] 98 % (04/30 0618) Last BM Date: 03/30/16 (per pt, denies constipation)  Intake/Output from previous day: 04/29 0701 - 04/30 0700 In: 480 [P.O.:480] Out: 850 [Urine:850] Intake/Output this shift:    Resp: clear to auscultation bilaterally Cardio: regular rate and rhythm GI: soft, non-tender; bowel sounds normal; no masses,  no organomegaly Male genitalia: wound looks clean  Lab Results:   Recent Labs  04/01/16 0444 04/02/16 0724  WBC 13.7* 14.1*  HGB 12.8* 12.5*  HCT 39.3 38.3*  PLT 496* 467*   BMET  Recent Labs  04/01/16 0444 04/02/16 0539  NA 138 143  K 4.6 4.0  CL 105 109  CO2 24 25  GLUCOSE 140* 111*  BUN 11 12  CREATININE 1.11 1.09  CALCIUM 8.6* 8.0*   PT/INR  Recent Labs  03/31/16 1220  LABPROT 13.3  INR 0.99   ABG No results for input(s): PHART, HCO3 in the last 72 hours.  Invalid input(s): PCO2, PO2  Studies/Results: Ct Pelvis W Contrast  03/31/2016  CLINICAL DATA:  Left groin abscess.  Elevated white blood count. EXAM: CT PELVIS WITH CONTRAST TECHNIQUE: Multidetector CT imaging of the pelvis was performed using the standard protocol following the bolus administration of intravenous contrast. CONTRAST:  100mL ISOVUE-300 IOPAMIDOL (ISOVUE-300) INJECTION 61% COMPARISON:  None. FINDINGS: There is a 6.2 x 5.5 x 2.4 cm irregular abscess in the subcutaneous fat of the inner aspect of the proximal left thigh just lateral to the scrotum. This extends to the superficial fascia planes of muscles but does not extend into the muscles. Slight reactive adenopathy in the left groin. No other acute abnormality.  Slight degenerative changes of both hips. Slight facet arthritis at L4-5 bilaterally. The visualized bowel is normal.  Slight aortic atherosclerosis. IMPRESSION: Subcutaneous soft tissue abscess in the medial aspect of the proximal left thigh as described. No deep extension. Electronically Signed   By: Francene BoyersJames  Maxwell M.D.   On: 03/31/2016 15:12    Anti-infectives: Anti-infectives    Start     Dose/Rate Route Frequency Ordered Stop   04/03/16 0600  vancomycin (VANCOCIN) IVPB 750 mg/150 ml premix     750 mg 150 mL/hr over 60 Minutes Intravenous Every 12 hours 04/02/16 0720     03/31/16 1430  vancomycin (VANCOCIN) IVPB 1000 mg/200 mL premix  Status:  Discontinued     1,000 mg 200 mL/hr over 60 Minutes Intravenous Every 8 hours 03/31/16 1428 04/02/16 0720   03/31/16 1415  piperacillin-tazobactam (ZOSYN) IVPB 3.375 g     3.375 g 12.5 mL/hr over 240 Minutes Intravenous Every 8 hours 03/31/16 1411        Assessment/Plan: s/p Procedure(s): INCISION AND DRAINAGE OF LEFT INNER THIGH ABSCESS (Left) Advance diet Discharge     TOTH III,Ermina Oberman S 04/02/2016

## 2016-04-02 NOTE — Progress Notes (Signed)
Pharmacy Antibiotic Note  Alan Goodman is a 50 y.o. male admitted on 03/31/2016 with cellulitis/abscess of groin region for past week that has worsened.  S/p I&D 4/28. Pharmacy has been consulted for Vancomycin dosing.  04/02/2016:   Afebrile  Mild leukocytosis; LA wnl  Renal fxn at baseline.  NCrCl~6285ml/min.  Vancomycin trough elevated (27 mcg/ml) before 6th dose off 1g q8h  Plan:  Decrease Vancomycin to 750mg  IV q12h - next dose 5/1 at 6AM  Recheck Vancomycin trough at new steady state if continues  Zosyn per MD  Monitor renal function and cx data   Height: 5\' 9"  (175.3 cm) Weight: 210 lb (95.255 kg) IBW/kg (Calculated) : 70.7  Temp (24hrs), Avg:98.1 F (36.7 C), Min:97.9 F (36.6 C), Max:98.3 F (36.8 C)   Recent Labs Lab 03/31/16 1220 03/31/16 1222 04/01/16 0444 04/02/16 0539  WBC 11.6*  --  13.7*  --   CREATININE 1.13  --  1.11 1.09  LATICACIDVEN  --  1.34  --   --   VANCOTROUGH  --   --   --  27*    Estimated Creatinine Clearance: 93.3 mL/min (by C-G formula based on Cr of 1.09).    No Known Allergies  Antimicrobials this admission: Vancomycin 4/28>> Zosyn 4/28 >>   Dose adjustments this admission: 4/30: VT = 27 before 6th dose of 1g q8h (AM dose already given) - decrease 750mg  q12h  Microbiology results: 4/28 BCx: ngtd 4/28 Wound @1215 : mod GPC clusters, rare GNR. 4/28 Wound from I&D @2004 : sent 4/28 MRSA PCR: positive  Thank you for allowing pharmacy to be a part of this patient's care.  Loralee PacasErin Shirlena Brinegar, PharmD, BCPS Pager: [phone]  04/02/2016 7:21 AM

## 2016-04-02 NOTE — Anesthesia Postprocedure Evaluation (Signed)
Anesthesia Post Note  Patient: Alan Goodman  Procedure(s) Performed: Procedure(s) (LRB): INCISION AND DRAINAGE OF LEFT INNER THIGH ABSCESS (Left)  Patient location during evaluation: PACU Anesthesia Type: General Level of consciousness: awake and alert and patient cooperative Pain management: pain level controlled Vital Signs Assessment: post-procedure vital signs reviewed and stable Respiratory status: spontaneous breathing and respiratory function stable Cardiovascular status: stable Anesthetic complications: no    Last Vitals:  Filed Vitals:   04/01/16 2236 04/02/16 0618  BP: 113/52 104/60  Pulse: 72 64  Temp: 36.8 C 36.7 C  Resp: 18 18    Last Pain:  Filed Vitals:   04/02/16 0620  PainSc: 0-No pain                 Lanee Chain S

## 2016-04-03 ENCOUNTER — Encounter (HOSPITAL_COMMUNITY): Payer: Self-pay | Admitting: General Surgery

## 2016-04-03 LAB — CULTURE, ROUTINE-ABSCESS

## 2016-04-03 LAB — WOUND CULTURE

## 2016-04-05 LAB — ANAEROBIC CULTURE

## 2016-04-05 LAB — CULTURE, BLOOD (ROUTINE X 2)
CULTURE: NO GROWTH
CULTURE: NO GROWTH

## 2016-04-07 NOTE — Discharge Summary (Signed)
Physician Discharge Summary  Silvestre GunnerDouglas L Staley ION:[id number]:[id number] DOB: 09/06/1966 DOA: 03/31/2016  PCP: No primary care provider on file.  Consultation: none  Admit date: 03/31/2016 Discharge date:04/02/2016  Recommendations for Outpatient Follow-up:   Follow-up Information    [facility]   Contact information:   [address] [place] [phone]      Follow up with Robyne AskewTH III,PAUL S, MD In 2 weeks.   Specialty:  General Surgery   Contact information:   [address] [place] [address] [phone]      Discharge Diagnoses:  1. Left inner thigh abscess   Surgical Procedure: I&D of abscess---Dr. Carolynne Edouardoth  Discharge Condition: stable Disposition: home  Diet recommendation: regular  Filed Weights   03/31/16 1124  Weight: 95.255 kg (210 lb)     HPI: Alan KentDouglas Goodman is a 50 year old male who [facility] with a left groin abscess. He reports developing malaise and generalized fatigue about 1 week ago and thereafter groin pain. About 3 days ago, he noticed purulent drainage which spontaneously started draining. He has not been able to inspect the area and doesn't know if it has changed or not. Denies previous symptoms. He is a former smoker, but still "vapes." Denies diabetes. His work up shows, WBC 11.6. He was unable to get [facility] as it was not working and therefore [facility].   Hospital Course:  The patient was admitted and underwent the procedure listed above. He was transferred to the floor for dressing changes and IV antibiotics.  On POD#2 he was felt stable for discharge home.   Discharge Instructions  Discharge Instructions    Call MD for:  difficulty breathing, headache or visual disturbances    Complete by:  As directed      Call MD for:  extreme fatigue    Complete by:  As directed      Call MD for:  hives    Complete by:  As directed      Call MD for:   persistant dizziness or light-headedness    Complete by:  As directed      Call MD for:  persistant nausea and vomiting    Complete by:  As directed      Call MD for:  redness, tenderness, or signs of infection (pain, swelling, redness, odor or green/yellow discharge around incision site)    Complete by:  As directed      Call MD for:  severe uncontrolled pain    Complete by:  As directed      Call MD for:  temperature >100.4    Complete by:  As directed      Diet - low sodium heart healthy    Complete by:  As directed      Discharge instructions    Complete by:  As directed   Remove packing and shower daily then repack wound. Diet as tolerated     Increase activity slowly    Complete by:  As directed             Medication List    TAKE these medications        doxycycline 100 MG tablet  Commonly known as:  VIBRA-TABS  Take 1 tablet (100 mg total) by mouth 2 (two) times daily.     oxyCODONE-acetaminophen 5-325 MG tablet  Commonly known as:  ROXICET  Take 1-2 tablets by mouth every  4 (four) hours as needed for severe pain.           Follow-up Information    Follow up with [facility]   Contact information:   [address] [place] [address] [phone]      Follow up with Robyne Askew, MD In 2 weeks.   Specialty:  General Surgery   Contact information:   [address] [place] [address] [phone]        The results of significant diagnostics from this hospitalization (including imaging, microbiology, ancillary and laboratory) are listed below for reference.    Significant Diagnostic Studies: Ct Pelvis W Contrast  03/31/2016  CLINICAL DATA:  Left groin abscess.  Elevated white blood count. EXAM: CT PELVIS WITH CONTRAST TECHNIQUE: Multidetector CT imaging of the pelvis was performed using the standard protocol following the bolus administration of intravenous contrast. CONTRAST:  ISOVUE-300  IOPAMIDOL (ISOVUE-300) INJECTION 61% COMPARISON:  None. FINDINGS: There is a 6.2 x 5.5 x 2.4 cm irregular abscess in the subcutaneous fat of the inner aspect of the proximal left thigh just lateral to the scrotum. This extends to the superficial fascia planes of muscles but does not extend into the muscles. Slight reactive adenopathy in the left groin. No other acute abnormality. Slight degenerative changes of both hips. Slight facet arthritis at L4-5 bilaterally. The visualized bowel is normal.  Slight aortic atherosclerosis. IMPRESSION: Subcutaneous soft tissue abscess in the medial aspect of the proximal left thigh as described. No deep extension. Electronically Signed   By: Francene Boyers M.D.   On: 03/31/2016 15:12    Microbiology: Recent Results (from the past 240 hour(s))  Wound culture     Status: None   Collection Time: 03/31/16 12:15 PM  Result Value Ref Range Status   Specimen Description ABSCESS  Final   Special Requests LEG LEFT PROXIMAL POSTERIOR THIGH  Final   Gram Stain   Final    ABUNDANT WBC PRESENT,BOTH PMN AND MONONUCLEAR NO SQUAMOUS EPITHELIAL CELLS SEEN MODERATE GRAM POSITIVE COCCI IN CLUSTERS RARE GRAM NEGATIVE RODS Performed at [facility]    Culture   Final    FEW METHICILLIN RESISTANT STAPHYLOCOCCUS AUREUS Note: RIFAMPIN AND GENTAMICIN SHOULD NOT BE USED AS SINGLE DRUGS FOR TREATMENT OF STAPH INFECTIONS. This organism DOES NOT demonstrate inducible Clindamycin resistance in vitro. Performed at [facility]    Report Status 04/03/2016 FINAL  Final   Organism ID, Bacteria METHICILLIN RESISTANT STAPHYLOCOCCUS AUREUS  Final      Susceptibility   Methicillin resistant staphylococcus aureus - MIC*    CLINDAMYCIN <=0.25 SENSITIVE Sensitive     ERYTHROMYCIN >=8 RESISTANT Resistant     GENTAMICIN <=0.5 SENSITIVE Sensitive     LEVOFLOXACIN 0.25 SENSITIVE Sensitive     OXACILLIN >=4 RESISTANT Resistant     RIFAMPIN <=0.5 SENSITIVE Sensitive      TRIMETH/SULFA <=10 SENSITIVE Sensitive     VANCOMYCIN 1 SENSITIVE Sensitive     TETRACYCLINE <=1 SENSITIVE Sensitive     * FEW METHICILLIN RESISTANT STAPHYLOCOCCUS AUREUS  Blood culture (routine x 2)     Status: None   Collection Time: 03/31/16 12:20 PM  Result Value Ref Range Status   Specimen Description BLOOD RIGHT ANTECUBITAL  Final   Special Requests BOTTLES DRAWN AEROBIC AND ANAEROBIC 6CC  Final   Culture   Final    NO GROWTH 5 DAYS Performed at [facility]    Report Status 04/05/2016 FINAL  Final  Blood culture (routine x 2)     Status: None   Collection Time: 03/31/16 12:20 PM  Result Value Ref Range Status   Specimen Description BLOOD LEFT ANTECUBITAL  Final   Special Requests BOTTLES DRAWN AEROBIC AND ANAEROBIC 5 ML  Final   Culture   Final    NO GROWTH 5 DAYS Performed at [facility]    Report Status 04/05/2016 FINAL  Final  MRSA PCR Screening     Status: Abnormal   Collection Time: 03/31/16  5:50 PM  Result Value Ref Range Status   MRSA by PCR POSITIVE (A) NEGATIVE Final    Comment:        The GeneXpert MRSA Assay (FDA approved for NASAL specimens only), is one component of a comprehensive MRSA colonization surveillance program. It is not intended to diagnose MRSA infection nor to guide or monitor treatment for MRSA infections. RESULT CALLED TO, READ BACK BY AND VERIFIED WITH: Thyra Breed [id number] @ 1936 BY J SCOTTON   Anaerobic culture     Status: Abnormal   Collection Time: 03/31/16  8:04 PM  Result Value Ref Range Status   Specimen Description ABSCESS  Final   Special Requests NONE  Final   Gram Stain   Final    FEW WBC PRESENT,BOTH PMN AND MONONUCLEAR NO SQUAMOUS EPITHELIAL CELLS SEEN NO ORGANISMS SEEN Performed at [facility]    Culture (A)  Final    BACTEROIDES FRAGILIS Note: BETA LACTAMASE POSITIVE Performed at [facility]    Report Status 04/05/2016 FINAL  Final  Culture, routine-abscess     Status: None    Collection Time: 03/31/16  8:04 PM  Result Value Ref Range Status   Specimen Description ABSCESS  Final   Special Requests NONE  Final   Gram Stain   Final    ABUNDANT WBC PRESENT,BOTH PMN AND MONONUCLEAR FEW SQUAMOUS EPITHELIAL CELLS PRESENT FEW GRAM POSITIVE COCCI IN CLUSTERS Performed at [facility]    Culture   Final    MODERATE METHICILLIN RESISTANT STAPHYLOCOCCUS AUREUS Note: RIFAMPIN AND GENTAMICIN SHOULD NOT BE USED AS SINGLE DRUGS FOR TREATMENT OF STAPH INFECTIONS. This organism DOES NOT demonstrate inducible Clindamycin resistance in vitro. Performed at [facility]    Report Status 04/03/2016 FINAL  Final   Organism ID, Bacteria METHICILLIN RESISTANT STAPHYLOCOCCUS AUREUS  Final      Susceptibility   Methicillin resistant staphylococcus aureus - MIC*    CLINDAMYCIN <=0.25 SENSITIVE Sensitive     ERYTHROMYCIN >=8 RESISTANT Resistant     GENTAMICIN <=0.5 SENSITIVE Sensitive     LEVOFLOXACIN 0.25 SENSITIVE Sensitive     OXACILLIN >=4 RESISTANT Resistant     RIFAMPIN <=0.5 SENSITIVE Sensitive     TRIMETH/SULFA <=10 SENSITIVE Sensitive     VANCOMYCIN 1 SENSITIVE Sensitive     TETRACYCLINE <=1 SENSITIVE Sensitive     * MODERATE METHICILLIN RESISTANT STAPHYLOCOCCUS AUREUS     Labs: Basic Metabolic Panel:  Recent Labs Lab 04/01/16 0444 04/02/16 0539  NA 138 143  K 4.6 4.0  CL 105 109  CO2 24 25  GLUCOSE 140* 111*  BUN 11 12  CREATININE 1.11 1.09  CALCIUM 8.6* 8.0*   Liver Function Tests: No results for input(s): AST, ALT, ALKPHOS, BILITOT, PROT, ALBUMIN in the last 168 hours. No results for input(s): LIPASE, AMYLASE in the last 168 hours. No results for input(s): AMMONIA in the last 168 hours. CBC:  Recent Labs Lab 04/01/16 0444 04/02/16 9147  WBC 13.7* 14.1*  HGB 12.8* 12.5*  HCT 39.3 38.3*  MCV 89.1 89.1  PLT 496* 467*   Cardiac Enzymes: No results for input(s): CKTOTAL, CKMB, CKMBINDEX, TROPONINI in the last 168  hours. BNP: BNP (last 3 results) No results for input(s): BNP in the last 8760 hours.  ProBNP (last 3 results) No results for input(s): PROBNP in the last 8760 hours.  CBG: No results for input(s): GLUCAP in the last 168 hours.  Active Problems:   Abscess of left groin   Time coordinating discharge: <30 mins   Signed:  Rhylen Pulido, ANP-BC

## 2019-01-29 ENCOUNTER — Other Ambulatory Visit: Payer: Self-pay

## 2019-01-29 ENCOUNTER — Encounter (HOSPITAL_BASED_OUTPATIENT_CLINIC_OR_DEPARTMENT_OTHER): Payer: Self-pay | Admitting: Emergency Medicine

## 2019-01-29 ENCOUNTER — Emergency Department (HOSPITAL_BASED_OUTPATIENT_CLINIC_OR_DEPARTMENT_OTHER): Payer: Self-pay

## 2019-01-29 ENCOUNTER — Observation Stay (HOSPITAL_BASED_OUTPATIENT_CLINIC_OR_DEPARTMENT_OTHER)
Admission: EM | Admit: 2019-01-29 | Discharge: 2019-01-30 | Disposition: A | Discharge status: Home / Self Care | Payer: Self-pay | Attending: General Surgery | Admitting: General Surgery

## 2019-01-29 DIAGNOSIS — F17290 Nicotine dependence, other tobacco product, uncomplicated: Secondary | ICD-10-CM | POA: Insufficient documentation

## 2019-01-29 DIAGNOSIS — R1013 Epigastric pain: Secondary | ICD-10-CM

## 2019-01-29 DIAGNOSIS — K8010 Calculus of gallbladder with chronic cholecystitis without obstruction: Principal | ICD-10-CM | POA: Insufficient documentation

## 2019-01-29 DIAGNOSIS — K8020 Calculus of gallbladder without cholecystitis without obstruction: Secondary | ICD-10-CM | POA: Diagnosis present

## 2019-01-29 DIAGNOSIS — K219 Gastro-esophageal reflux disease without esophagitis: Secondary | ICD-10-CM | POA: Insufficient documentation

## 2019-01-29 DIAGNOSIS — Z79899 Other long term (current) drug therapy: Secondary | ICD-10-CM | POA: Insufficient documentation

## 2019-01-29 DIAGNOSIS — K8080 Other cholelithiasis without obstruction: Secondary | ICD-10-CM

## 2019-01-29 HISTORY — DX: Gastro-esophageal reflux disease without esophagitis: K21.9

## 2019-01-29 LAB — CBC
HEMATOCRIT: 44.5 % (ref 39.0–52.0)
HEMOGLOBIN: 14.2 g/dL (ref 13.0–17.0)
MCH: 28.8 pg (ref 26.0–34.0)
MCHC: 31.9 g/dL (ref 30.0–36.0)
MCV: 90.3 fL (ref 80.0–100.0)
PLATELETS: 277 10*3/uL (ref 150–400)
RBC: 4.93 MIL/uL (ref 4.22–5.81)
RDW: 13.5 % (ref 11.5–15.5)
WBC: 9.5 10*3/uL (ref 4.0–10.5)
nRBC: 0 % (ref 0.0–0.2)

## 2019-01-29 LAB — COMPREHENSIVE METABOLIC PANEL
ALT: 21 U/L (ref 0–44)
AST: 21 U/L (ref 15–41)
Albumin: 3.9 g/dL (ref 3.5–5.0)
Alkaline Phosphatase: 82 U/L (ref 38–126)
Anion gap: 6 (ref 5–15)
BUN: 13 mg/dL (ref 6–20)
CO2: 25 mmol/L (ref 22–32)
Calcium: 8.6 mg/dL — ABNORMAL LOW (ref 8.9–10.3)
Chloride: 105 mmol/L (ref 98–111)
Creatinine, Ser: 1.17 mg/dL (ref 0.61–1.24)
GFR calc Af Amer: >60 mL/min (ref >60)
GFR calc non Af Amer: >60 mL/min (ref >60)
Glucose, Bld: 156 mg/dL — ABNORMAL HIGH (ref 70–99)
Potassium: 4 mmol/L (ref 3.5–5.1)
Sodium: 136 mmol/L (ref 135–145)
Total Bilirubin: 0.2 mg/dL — ABNORMAL LOW (ref 0.3–1.2)
Total Protein: 7 g/dL (ref 6.5–8.1)

## 2019-01-29 LAB — LIPASE, BLOOD: Lipase: 29 U/L (ref 11–51)

## 2019-01-29 LAB — SURGICAL PCR SCREEN
MRSA, PCR: NEGATIVE
Staphylococcus aureus: NEGATIVE

## 2019-01-29 LAB — TROPONIN I

## 2019-01-29 MED ORDER — LIDOCAINE VISCOUS HCL 2 % MT SOLN
15.0000 mL | Freq: Once | OROMUCOSAL | Status: AC
  Administered 2019-01-29: 15 mL via ORAL
  Filled 2019-01-29: qty 15

## 2019-01-29 MED ORDER — DIPHENHYDRAMINE HCL 12.5 MG/5ML PO ELIX: 12.5000 mg | ORAL_SOLUTION | Freq: Four times a day (QID) | ORAL | Status: DC | PRN

## 2019-01-29 MED ORDER — GABAPENTIN 300 MG PO CAPS
300.0000 mg | ORAL_CAPSULE | ORAL | Status: AC
  Administered 2019-01-30: 300 mg via ORAL
  Filled 2019-01-29: qty 1

## 2019-01-29 MED ORDER — METHOCARBAMOL 500 MG PO TABS: 500.0000 mg | ORAL_TABLET | Freq: Four times a day (QID) | ORAL | Status: DC | PRN

## 2019-01-29 MED ORDER — ALUM & MAG HYDROXIDE-SIMETH 200-200-20 MG/5ML PO SUSP
30.0000 mL | Freq: Once | ORAL | Status: AC
  Administered 2019-01-29: 30 mL via ORAL
  Filled 2019-01-29: qty 30

## 2019-01-29 MED ORDER — POLYETHYLENE GLYCOL 3350 17 G PO PACK: 17.0000 g | PACK | Freq: Every day | ORAL | Status: DC | PRN

## 2019-01-29 MED ORDER — ONDANSETRON HCL 4 MG/2ML IJ SOLN: 4.0000 mg | Freq: Four times a day (QID) | INTRAMUSCULAR | Status: DC | PRN

## 2019-01-29 MED ORDER — ACETAMINOPHEN 650 MG RE SUPP: 650.0000 mg | Freq: Four times a day (QID) | RECTAL | Status: DC | PRN

## 2019-01-29 MED ORDER — ACETAMINOPHEN 500 MG PO TABS
1000.0000 mg | ORAL_TABLET | ORAL | Status: AC
  Administered 2019-01-30: 1000 mg via ORAL
  Filled 2019-01-29: qty 2

## 2019-01-29 MED ORDER — MORPHINE SULFATE (PF) 2 MG/ML IV SOLN: 2.0000 mg | INTRAVENOUS | Status: DC | PRN

## 2019-01-29 MED ORDER — PANTOPRAZOLE SODIUM 40 MG IV SOLR
40.0000 mg | Freq: Every day | INTRAVENOUS | Status: DC
  Administered 2019-01-29: 40 mg via INTRAVENOUS
  Filled 2019-01-29: qty 40

## 2019-01-29 MED ORDER — ENOXAPARIN SODIUM 40 MG/0.4ML ~~LOC~~ SOLN
40.0000 mg | SUBCUTANEOUS | Status: DC
  Administered 2019-01-29: 40 mg via SUBCUTANEOUS
  Filled 2019-01-29: qty 0.4

## 2019-01-29 MED ORDER — MORPHINE SULFATE (PF) 4 MG/ML IV SOLN
4.0000 mg | Freq: Once | INTRAVENOUS | Status: AC
  Administered 2019-01-29: 4 mg via INTRAVENOUS
  Filled 2019-01-29: qty 1

## 2019-01-29 MED ORDER — ONDANSETRON HCL 4 MG/2ML IJ SOLN
4.0000 mg | Freq: Once | INTRAMUSCULAR | Status: AC
  Administered 2019-01-29: 4 mg via INTRAVENOUS
  Filled 2019-01-29: qty 2

## 2019-01-29 MED ORDER — CELECOXIB 200 MG PO CAPS
200.0000 mg | ORAL_CAPSULE | ORAL | Status: AC
  Administered 2019-01-30: 200 mg via ORAL
  Filled 2019-01-29: qty 1

## 2019-01-29 MED ORDER — ONDANSETRON 4 MG PO TBDP: 4.0000 mg | ORAL_TABLET | Freq: Four times a day (QID) | ORAL | Status: DC | PRN

## 2019-01-29 MED ORDER — CHLORHEXIDINE GLUCONATE CLOTH 2 % EX PADS
6.0000 | MEDICATED_PAD | Freq: Once | CUTANEOUS | Status: AC
  Administered 2019-01-29: 6 via TOPICAL

## 2019-01-29 MED ORDER — ACETAMINOPHEN 325 MG PO TABS: 650.0000 mg | ORAL_TABLET | Freq: Four times a day (QID) | ORAL | Status: DC | PRN

## 2019-01-29 MED ORDER — DIPHENHYDRAMINE HCL 50 MG/ML IJ SOLN: 12.5000 mg | Freq: Four times a day (QID) | INTRAMUSCULAR | Status: DC | PRN

## 2019-01-29 MED ORDER — FAMOTIDINE IN NACL 20-0.9 MG/50ML-% IV SOLN
20.0000 mg | Freq: Once | INTRAVENOUS | Status: AC
  Administered 2019-01-29: 20 mg via INTRAVENOUS
  Filled 2019-01-29: qty 50

## 2019-01-29 MED ORDER — OXYCODONE HCL 5 MG PO TABS
5.0000 mg | ORAL_TABLET | ORAL | Status: DC | PRN
  Administered 2019-01-30: 5 mg via ORAL
  Filled 2019-01-29: qty 1

## 2019-01-29 MED ORDER — SODIUM CHLORIDE 0.9 % IV SOLN
2.0000 g | Freq: Every day | INTRAVENOUS | Status: DC
  Administered 2019-01-29 – 2019-01-30 (×2): 2 g via INTRAVENOUS
  Filled 2019-01-29 (×2): qty 2
  Filled 2019-01-29: qty 20

## 2019-01-29 MED ORDER — IOPAMIDOL (ISOVUE-370) INJECTION 76%
100.0000 mL | Freq: Once | INTRAVENOUS | Status: AC | PRN
  Administered 2019-01-29: 100 mL via INTRAVENOUS

## 2019-01-29 MED ORDER — DOCUSATE SODIUM 100 MG PO CAPS
100.0000 mg | ORAL_CAPSULE | Freq: Two times a day (BID) | ORAL | Status: DC
  Administered 2019-01-29: 100 mg via ORAL
  Filled 2019-01-29: qty 1

## 2019-01-29 MED ORDER — CHLORHEXIDINE GLUCONATE CLOTH 2 % EX PADS: 6.0000 | MEDICATED_PAD | Freq: Once | CUTANEOUS | Status: DC

## 2019-01-29 MED ORDER — HYDRALAZINE HCL 20 MG/ML IJ SOLN: 10.0000 mg | INTRAMUSCULAR | Status: DC | PRN

## 2019-01-29 MED ORDER — SODIUM CHLORIDE 0.9 % IV SOLN
INTRAVENOUS | Status: DC
  Administered 2019-01-29 – 2019-01-30 (×3): via INTRAVENOUS

## 2019-01-29 NOTE — ED Notes (Signed)
Reports called to receiving RN.

## 2019-01-29 NOTE — ED Notes (Signed)
Spoke to [employer], Consulting civil engineer at [place] ED , ready to receive patient.

## 2019-01-29 NOTE — ED Notes (Signed)
Pt returned from CT , IV discontinued by CT tech  at the CT department  due to infiltration after injecting The IV contrast.  IV site swollen , heat Pad applied. Pt denies pain.

## 2019-01-29 NOTE — ED Provider Notes (Signed)
[facility] EMERGENCY DEPARTMENT Provider Note   CSN: [id number] Arrival date & time: 01/29/19  8250    History   Chief Complaint Chief Complaint  Patient presents with  . Chest Pain    HPI Alan Goodman is a 53 y.o. male.     The history is provided by the patient and the spouse. No language interpreter was used.  Chest Pain   Alan Goodman is a 53 y.o. male who presents to the Emergency Department complaining of chest pain. He presents to the emergency department complaining of central chest pain that began last night. Pain is described as a constant pressure. It is located throughout his epigastric man central chest. He has associated diaphoresis with emesis times five. He states this feels like his prior [place]. He did try an acid reflux pill with no improvement in his symptoms. He denies any fevers, diarrhea, dysuria, bad food exposures. He does [place]. No alcohol or drug use. No prior surgeries. Symptoms are severe and constant in nature. Past Medical History:  Diagnosis Date  . GERD (gastroesophageal reflux disease)     Patient Active Problem List   Diagnosis Date Noted  . Symptomatic cholelithiasis 01/29/2019  . Abscess of left groin 03/31/2016    Past Surgical History:  Procedure Laterality Date  . INCISION AND DRAINAGE ABSCESS Left 03/31/2016   Procedure: INCISION AND DRAINAGE OF LEFT INNER THIGH ABSCESS;  Surgeon: Alan Pretty III, MD;  Location: [facility];  Service: General;  Laterality: Left;  . NECK SURGERY          Home Medications    Prior to Admission medications   Medication Sig Start Date End Date Taking? Authorizing Provider  omeprazole (PRILOSEC OTC) 20 MG tablet Take 20 mg by mouth daily.   Yes [provider]  doxycycline (VIBRA-TABS) 100 MG tablet Take 1 tablet (100 mg total) by mouth 2 (two) times daily. Patient not taking: Reported on 01/29/2019 04/02/16   Alan Pretty III, MD  oxyCODONE-acetaminophen (ROXICET) 5-325 MG tablet  Take 1-2 tablets by mouth every 4 (four) hours as needed for severe pain. Patient not taking: Reported on 01/29/2019 04/02/16   Griselda Miner, MD    Family History History reviewed. No pertinent family history.  Social History Social History   Tobacco Use  . Smoking status: Former Smoker    Types: Cigarettes    Last attempt to quit: 01/29/2013    Years since quitting: 6.0  . Smokeless tobacco: Never Used  Substance Use Topics  . Alcohol use: No  . Drug use: No     Allergies   Patient has no known allergies.   Review of Systems Review of Systems  Cardiovascular: Positive for chest pain.  All other systems reviewed and are negative.    Physical Exam Updated Vital Signs BP 138/84   Pulse 72   Temp 97.8 F (36.6 C) (Oral)   Resp 20   Ht 5\' 9"  (1.753 m)   Wt 104.3 kg   SpO2 99%   BMI 33.97 kg/m   Physical Exam Vitals signs and nursing note reviewed.  Constitutional:      Appearance: He is well-developed. He is diaphoretic.  HENT:     Head: Normocephalic and atraumatic.  Cardiovascular:     Rate and Rhythm: Regular rhythm. Bradycardia present.     Heart sounds: No murmur.  Pulmonary:     Effort: Pulmonary effort is normal. No respiratory distress.     Breath sounds: Normal breath  sounds.  Abdominal:     Palpations: Abdomen is soft.     Tenderness: There is no guarding or rebound.     Comments: Moderate epigastric tenderness  Musculoskeletal:        General: No tenderness.     Comments: 2+ DP pulses bilaterally  Skin:    General: Skin is warm.  Neurological:     Mental Status: He is alert and oriented to person, place, and time.  Psychiatric:        Behavior: Behavior normal.      ED Treatments / Results  Labs (all labs ordered are listed, but only abnormal results are displayed) Labs Reviewed  COMPREHENSIVE METABOLIC PANEL - Abnormal; Notable for the following components:      Result Value   Glucose, Bld 156 (*)    Calcium 8.6 (*)    Total  Bilirubin 0.2 (*)    All other components within normal limits  CBC  TROPONIN I  LIPASE, BLOOD  TROPONIN I  HIV ANTIBODY (ROUTINE TESTING W REFLEX)    EKG EKG Interpretation  Date/Time:  Wednesday January 29 2019 07:10:08 EST Ventricular Rate:  50 PR Interval:    QRS Duration: 111 QT Interval:  504 QTC Calculation: 460 R Axis:   77 Text Interpretation:  Sinus rhythm no prior available for comparison Confirmed by Alan Goodman [phone]) on 01/29/2019 7:12:39 AM Also confirmed by Alan Goodman [phone]), editor Barbette Hair [phone])  on 01/29/2019 8:01:30 AM   Radiology Dg Chest 2 View  Result Date: 01/29/2019 CLINICAL DATA:  Chest pain.  Epigastric pain and vomiting. EXAM: CHEST - 2 VIEW COMPARISON:  None. FINDINGS: The cardiomediastinal silhouette is within normal limits. The lungs are well inflated and clear. There is no evidence of pleural effusion or pneumothorax. No acute osseous abnormality is identified. Prior cervical spine fusion is noted. IMPRESSION: No active cardiopulmonary disease. Electronically Signed   By: Alan Goodman M.D.   On: 01/29/2019 07:59   Ct Angio Chest/abd/pel For Dissection W And/or W/wo  Result Date: 01/29/2019 CLINICAL DATA:  Chest and back pain, rule out aortic dissection EXAM: CT ANGIOGRAPHY CHEST, ABDOMEN AND PELVIS TECHNIQUE: Multidetector CT imaging through the chest, abdomen and pelvis was performed both prior to and following the intravenous administration of contrast using the standard protocol during bolus administration of intravenous contrast. Multiplanar reconstructed images and MIPs were obtained and reviewed to evaluate the vascular anatomy. CONTRAST:  ISOVUE-370 IOPAMIDOL (ISOVUE-370) INJECTION 76% COMPARISON:  Same day chest radiograph FINDINGS: CTA CHEST FINDINGS Cardiovascular: Satisfactory opacification of the pulmonary arteries to the segmental level. No evidence of pulmonary embolism. Normal heart size. Minimal left coronary artery  calcifications. No pericardial effusion. Mediastinum/Nodes: No enlarged mediastinal, hilar, or axillary lymph nodes. Thyroid gland, trachea, and esophagus demonstrate no significant findings. Lungs/Pleura: Lungs are clear. No pleural effusion or pneumothorax. Upper Abdomen: No acute abnormality. Musculoskeletal: No chest wall abnormality. No acute or significant osseous findings. Disc degenerative disease of the thoracic spine. Review of the MIP images confirms the above findings. CTA ABDOMEN AND PELVIS FINDINGS VASCULAR Aorta: Normal caliber aorta without aneurysm, dissection, vasculitis or significant stenosis. Minimal atherosclerosis of the inferior aorta. Celiac: Patent without evidence of aneurysm, dissection, vasculitis or significant stenosis. SMA: Patent without evidence of aneurysm, dissection, vasculitis or significant stenosis. Renals: Both renal arteries are patent without evidence of aneurysm, dissection, vasculitis, fibromuscular dysplasia or significant stenosis. IMA: Patent without evidence of aneurysm, dissection, vasculitis or significant stenosis. Inflow: Patent without evidence of aneurysm, dissection, vasculitis  or significant stenosis. Veins: No obvious venous abnormality within the limitations of this arterial phase study. Review of the MIP images confirms the above findings. NON-VASCULAR Lower chest: No acute abnormality. Hepatobiliary: No focal liver abnormality is seen. Large gallstone in the gallbladder near the gallbladder neck. No gallbladder wall thickening, or biliary dilatation. Pancreas: Unremarkable. No pancreatic ductal dilatation or surrounding inflammatory changes. Spleen: Normal in size without focal abnormality. Adrenals/Urinary Tract: Adrenal glands are unremarkable. Kidneys are normal, without renal calculi, focal lesion, or hydronephrosis. Bladder is unremarkable. Stomach/Bowel: Stomach is within normal limits. Appendix appears normal. No evidence of bowel wall thickening,  distention, or inflammatory changes. Vascular/Lymphatic: No significant vascular findings are present. No enlarged abdominal or pelvic lymph nodes. Reproductive: No mass or other abnormality. Other: No abdominal wall hernia or abnormality. No abdominopelvic ascites. Musculoskeletal: No acute or significant osseous findings. Review of the MIP images confirms the above findings. IMPRESSION: 1. No evidence of aortic dissection, aneurysm, or other acute aortic syndrome. Minimal atherosclerosis of the abdominal aorta. 2. Large gallstone in the gallbladder near the gallbladder neck. No gallbladder wall thickening or biliary dilatation. No pericholecystic fluid. Electronically Signed   By: Lauralyn Primes M.D.   On: 01/29/2019 11:06    Procedures Procedures (including critical care time)  Medications Ordered in ED Medications  enoxaparin (LOVENOX) injection 40 mg (has no administration in time range)  0.9 %  sodium chloride infusion (has no administration in time range)  cefTRIAXone (ROCEPHIN) 2 g in sodium chloride 0.9 % 100 mL IVPB (has no administration in time range)  acetaminophen (TYLENOL) tablet 650 mg (has no administration in time range)    Or  acetaminophen (TYLENOL) suppository 650 mg (has no administration in time range)  oxyCODONE (Oxy IR/ROXICODONE) immediate release tablet 5-10 mg (has no administration in time range)  morphine 2 MG/ML injection 2 mg (has no administration in time range)  methocarbamol (ROBAXIN) tablet 500 mg (has no administration in time range)  diphenhydrAMINE (BENADRYL) 12.5 MG/5ML elixir 12.5 mg (has no administration in time range)    Or  diphenhydrAMINE (BENADRYL) injection 12.5 mg (has no administration in time range)  docusate sodium (COLACE) capsule 100 mg (has no administration in time range)  polyethylene glycol (MIRALAX / GLYCOLAX) packet 17 g (has no administration in time range)  ondansetron (ZOFRAN-ODT) disintegrating tablet 4 mg (has no administration in  time range)    Or  ondansetron (ZOFRAN) injection 4 mg (has no administration in time range)  pantoprazole (PROTONIX) injection 40 mg (has no administration in time range)  hydrALAZINE (APRESOLINE) injection 10 mg (has no administration in time range)  Chlorhexidine Gluconate Cloth 2 % PADS 6 each (has no administration in time range)    And  Chlorhexidine Gluconate Cloth 2 % PADS 6 each (has no administration in time range)  acetaminophen (TYLENOL) tablet 1,000 mg (has no administration in time range)  gabapentin (NEURONTIN) capsule 300 mg (has no administration in time range)  celecoxib (CELEBREX) capsule 200 mg (has no administration in time range)  morphine 4 MG/ML injection 4 mg (4 mg Intravenous Given 01/29/19 0737)  ondansetron (ZOFRAN) injection 4 mg (4 mg Intravenous Given 01/29/19 0726)  alum & mag hydroxide-simeth (MAALOX/MYLANTA) 200-200-20 MG/5ML suspension 30 mL (30 mLs Oral Given 01/29/19 0737)    And  lidocaine (XYLOCAINE) 2 % viscous mouth solution 15 mL (15 mLs Oral Given 01/29/19 0737)  morphine 4 MG/ML injection 4 mg (4 mg Intravenous Given 01/29/19 1056)  famotidine (PEPCID) IVPB 20 mg premix (  0 mg Intravenous Stopped 01/29/19 1121)  iopamidol (ISOVUE-370) 76 % injection 100 mL (100 mLs Intravenous Contrast Given 01/29/19 0948)     Initial Impression / Assessment and Plan / ED Course  I have reviewed the triage vital signs and the nursing notes.  Pertinent labs & imaging results that were available during my care of the patient were reviewed by me and considered in my medical decision making (see chart for details).       Patient presents to the ED complaining of chest pain/abdominal pain.  EKG without acute ischemic changes and troponin is negative.  CTA obtained to r/o dissection in setting of CP/AP.    CT scan with large gallstone near gallbladder neck, patient with ongoing pain.  On exam pain is mostly epigastric.  Concern for possible symptomatic cholelithiasis in  setting of sxs. Discussed with patient findings of studies and recommendation for further observation and he is in agreement with plan. Discussed with Casimiro Needle, PA with general surgery at [facility], plan to transfer to the emergency department for further evaluation by general surgery. Discussed with Dr. Lynelle Doctor, ED physician, who accepts the patient in transfer.   Final Clinical Impressions(s) / ED Diagnoses   Final diagnoses:  None    ED Discharge Orders    None       Alan Fossa, MD 01/29/19 [phone]

## 2019-01-29 NOTE — ED Notes (Signed)
[facility] will transfer patient.

## 2019-01-29 NOTE — ED Notes (Signed)
Doug at [facility] notified of transfer to [facility] ED for Dr. Lynelle Doctor

## 2019-01-29 NOTE — Progress Notes (Signed)
ED TO INPATIENT HANDOFF REPORT  Name/Age/Gender Alan Goodman 53 y.o. male  Code Status    Code Status Orders  (From admission, onward)         Start     Ordered   01/29/19 1502  Full code  Continuous     01/29/19 1509        Code Status History    Date Active Date Inactive Code Status Order ID Comments User Context   03/31/2016 1424 04/02/2016 1400 Full Code [id number]  Ashok Norris, NP ED      Home/SNF/Other Home  Chief Complaint CHEST PAIN  Level of Care/Admitting Diagnosis ED Disposition    ED Disposition Condition Comment   Admit  Hospital Area: [facility] [[id number]]  Level of Care: Med-Surg [16]  Diagnosis: Symptomatic cholelithiasis [409811]  Admitting Physician: CCS, MD [[id number]]  Attending Physician: CCS, MD [[id number]]  Bed request comments: 5W  PT Class (Do Not Modify): Observation [104]  PT Acc Code (Do Not Modify): Observation [10022]       Medical History History reviewed. No pertinent past medical history.  Allergies No Known Allergies  IV Location/Drains/Wounds Patient Lines/Drains/Airways Status   Active Line/Drains/Airways    Name:   Placement date:   Placement time:   Site:   Days:   Peripheral IV 01/29/19 Right Arm   01/29/19    1056    Arm   less than 1   Incision (Closed) 03/31/16 Groin Left   03/31/16    2014     1034          Labs/Imaging Results for orders placed or performed during the hospital encounter of 01/29/19 (from the past 48 hour(s))  CBC     Status: None   Collection Time: 01/29/19  8:01 AM  Result Value Ref Range   WBC 9.5 4.0 - 10.5 K/uL   RBC 4.93 4.22 - 5.81 MIL/uL   Hemoglobin 14.2 13.0 - 17.0 g/dL   HCT 91.4 78.2 - 95.6 %   MCV 90.3 80.0 - 100.0 fL   MCH 28.8 26.0 - 34.0 pg   MCHC 31.9 30.0 - 36.0 g/dL   RDW 21.3 08.6 - 57.8 %   Platelets 277 150 - 400 K/uL   nRBC 0.0 0.0 - 0.2 %    Comment: Performed at [facility], [address] [place], [place] [address]  Troponin I  - ONCE - STAT     Status: None   Collection Time: 01/29/19  8:02 AM  Result Value Ref Range   Troponin I <0.03 <0.03 ng/mL    Comment: Performed at [facility], [address] [place], [place] [address]  Lipase, blood     Status: None   Collection Time: 01/29/19  8:02 AM  Result Value Ref Range   Lipase 29 11 - 51 U/L    Comment: Performed at [facility], [address] [place], [place] [address]  Comprehensive metabolic panel     Status: Abnormal   Collection Time: 01/29/19  8:02 AM  Result Value Ref Range   Sodium 136 135 - 145 mmol/L   Potassium 4.0 3.5 - 5.1 mmol/L   Chloride 105 98 - 111 mmol/L   CO2 25 22 - 32 mmol/L   Glucose, Bld 156 (H) 70 - 99 mg/dL   BUN 13 6 - 20 mg/dL   Creatinine, Ser 0.10 0.61 - 1.24 mg/dL   Calcium 8.6 (L) 8.9 -  10.3 mg/dL   Total Protein 7.0 6.5 - 8.1 g/dL   Albumin 3.9 3.5 - 5.0 g/dL   AST 21 15 - 41 U/L   ALT 21 0 - 44 U/L   Alkaline Phosphatase 82 38 - 126 U/L   Total Bilirubin 0.2 (L) 0.3 - 1.2 mg/dL   GFR calc non Af Amer >60 >60 mL/min   GFR calc Af Amer >60 >60 mL/min   Anion gap 6 5 - 15    Comment: Performed at [facility], [address] [place], [place] [address]  Troponin I - Once     Status: None   Collection Time: 01/29/19 12:07 PM  Result Value Ref Range   Troponin I <0.03 <0.03 ng/mL    Comment: Performed at [facility], [address], [place], [place] [address]   Dg Chest 2 View  Result Date: 01/29/2019 CLINICAL DATA:  Chest pain.  Epigastric pain and vomiting. EXAM: CHEST - 2 VIEW COMPARISON:  None. FINDINGS: The cardiomediastinal silhouette is within normal limits. The lungs are well inflated and clear. There is no evidence of pleural effusion or pneumothorax. No acute osseous abnormality is identified. Prior cervical spine fusion is noted. IMPRESSION: No active cardiopulmonary disease. Electronically Signed   By: Sebastian Ache M.D.   On: 01/29/2019 07:59   Ct Angio  Chest/abd/pel For Dissection W And/or W/wo  Result Date: 01/29/2019 CLINICAL DATA:  Chest and back pain, rule out aortic dissection EXAM: CT ANGIOGRAPHY CHEST, ABDOMEN AND PELVIS TECHNIQUE: Multidetector CT imaging through the chest, abdomen and pelvis was performed both prior to and following the intravenous administration of contrast using the standard protocol during bolus administration of intravenous contrast. Multiplanar reconstructed images and MIPs were obtained and reviewed to evaluate the vascular anatomy. CONTRAST:  ISOVUE-370 IOPAMIDOL (ISOVUE-370) INJECTION 76% COMPARISON:  Same day chest radiograph FINDINGS: CTA CHEST FINDINGS Cardiovascular: Satisfactory opacification of the pulmonary arteries to the segmental level. No evidence of pulmonary embolism. Normal heart size. Minimal left coronary artery calcifications. No pericardial effusion. Mediastinum/Nodes: No enlarged mediastinal, hilar, or axillary lymph nodes. Thyroid gland, trachea, and esophagus demonstrate no significant findings. Lungs/Pleura: Lungs are clear. No pleural effusion or pneumothorax. Upper Abdomen: No acute abnormality. Musculoskeletal: No chest wall abnormality. No acute or significant osseous findings. Disc degenerative disease of the thoracic spine. Review of the MIP images confirms the above findings. CTA ABDOMEN AND PELVIS FINDINGS VASCULAR Aorta: Normal caliber aorta without aneurysm, dissection, vasculitis or significant stenosis. Minimal atherosclerosis of the inferior aorta. Celiac: Patent without evidence of aneurysm, dissection, vasculitis or significant stenosis. SMA: Patent without evidence of aneurysm, dissection, vasculitis or significant stenosis. Renals: Both renal arteries are patent without evidence of aneurysm, dissection, vasculitis, fibromuscular dysplasia or significant stenosis. IMA: Patent without evidence of aneurysm, dissection, vasculitis or significant stenosis. Inflow: Patent without evidence  of aneurysm, dissection, vasculitis or significant stenosis. Veins: No obvious venous abnormality within the limitations of this arterial phase study. Review of the MIP images confirms the above findings. NON-VASCULAR Lower chest: No acute abnormality. Hepatobiliary: No focal liver abnormality is seen. Large gallstone in the gallbladder near the gallbladder neck. No gallbladder wall thickening, or biliary dilatation. Pancreas: Unremarkable. No pancreatic ductal dilatation or surrounding inflammatory changes. Spleen: Normal in size without focal abnormality. Adrenals/Urinary Tract: Adrenal glands are unremarkable. Kidneys are normal, without renal calculi, focal lesion, or hydronephrosis. Bladder is unremarkable. Stomach/Bowel: Stomach is within normal limits. Appendix appears normal. No evidence of bowel wall thickening,  distention, or inflammatory changes. Vascular/Lymphatic: No significant vascular findings are present. No enlarged abdominal or pelvic lymph nodes. Reproductive: No mass or other abnormality. Other: No abdominal wall hernia or abnormality. No abdominopelvic ascites. Musculoskeletal: No acute or significant osseous findings. Review of the MIP images confirms the above findings. IMPRESSION: 1. No evidence of aortic dissection, aneurysm, or other acute aortic syndrome. Minimal atherosclerosis of the abdominal aorta. 2. Large gallstone in the gallbladder near the gallbladder neck. No gallbladder wall thickening or biliary dilatation. No pericholecystic fluid. Electronically Signed   By: Lauralyn Primes M.D.   On: 01/29/2019 11:06    Pending Labs Unresulted Labs (From admission, onward)    Start     Ordered   02/05/19 0500  Creatinine, serum  (enoxaparin (LOVENOX)    CrCl >/= 30 ml/min)  Weekly,   R    Comments:  while on enoxaparin therapy    01/29/19 1509   01/30/19 0500  Comprehensive metabolic panel  Tomorrow morning,   R     01/29/19 1509   01/30/19 0500  CBC  Tomorrow morning,   R      01/29/19 1509   01/29/19 1502  HIV antibody (Routine Testing)  Once,   R     01/29/19 1509          Vitals/Pain Today's Vitals   01/29/19 1130 01/29/19 1200 01/29/19 1354 01/29/19 1357  BP: (!) 146/99 123/72 132/73   Pulse: 61 61 (!) 59   Resp: (!) 26 (!) 22 20   Temp:   97.8 F (36.6 C)   TempSrc:   Oral   SpO2: 99% 97% 97%   Weight:    104.3 kg  Height:    5\' 9"  (1.753 m)  PainSc:   7      Isolation Precautions No active isolations  Medications Medications  enoxaparin (LOVENOX) injection 40 mg (has no administration in time range)  0.9 %  sodium chloride infusion (has no administration in time range)  cefTRIAXone (ROCEPHIN) 2 g in sodium chloride 0.9 % 100 mL IVPB (has no administration in time range)  acetaminophen (TYLENOL) tablet 650 mg (has no administration in time range)    Or  acetaminophen (TYLENOL) suppository 650 mg (has no administration in time range)  oxyCODONE (Oxy IR/ROXICODONE) immediate release tablet 5-10 mg (has no administration in time range)  morphine 2 MG/ML injection 2 mg (has no administration in time range)  methocarbamol (ROBAXIN) tablet 500 mg (has no administration in time range)  diphenhydrAMINE (BENADRYL) 12.5 MG/5ML elixir 12.5 mg (has no administration in time range)    Or  diphenhydrAMINE (BENADRYL) injection 12.5 mg (has no administration in time range)  docusate sodium (COLACE) capsule 100 mg (has no administration in time range)  polyethylene glycol (MIRALAX / GLYCOLAX) packet 17 g (has no administration in time range)  ondansetron (ZOFRAN-ODT) disintegrating tablet 4 mg (has no administration in time range)    Or  ondansetron (ZOFRAN) injection 4 mg (has no administration in time range)  pantoprazole (PROTONIX) injection 40 mg (has no administration in time range)  hydrALAZINE (APRESOLINE) injection 10 mg (has no administration in time range)  morphine 4 MG/ML injection 4 mg (4 mg Intravenous Given 01/29/19 0737)  ondansetron  (ZOFRAN) injection 4 mg (4 mg Intravenous Given 01/29/19 0726)  alum & mag hydroxide-simeth (MAALOX/MYLANTA) 200-200-20 MG/5ML suspension 30 mL (30 mLs Oral Given 01/29/19 0737)    And  lidocaine (XYLOCAINE) 2 % viscous mouth solution 15 mL (15 mLs Oral Given  01/29/19 0737)  morphine 4 MG/ML injection 4 mg (4 mg Intravenous Given 01/29/19 1056)  famotidine (PEPCID) IVPB 20 mg premix (0 mg Intravenous Stopped 01/29/19 1121)  iopamidol (ISOVUE-370) 76 % injection 100 mL (100 mLs Intravenous Contrast Given 01/29/19 0948)    Mobility walks

## 2019-01-29 NOTE — ED Provider Notes (Signed)
  Physical Exam  BP 123/72   Pulse 61   Temp 97.9 F (36.6 C) (Oral)   Resp (!) 22   SpO2 97%   Assumed care from Dr. Tilden Fossa at 1:54 PM after transfer from [facility]. Briefly, the patient is a 53 y.o. male with PMHx of  has no past medical history on file. here with epigastric pain, nausea, vomiting since last night.   He is currently being transferred for evaluation by general surgery after CT angiogram chest abdomen and pelvis demonstrates large gallstone in the gallbladder neck.  No other acute cardiopulmonary or vascular findings, and this is presumed source of pain.  Labs Reviewed  COMPREHENSIVE METABOLIC PANEL - Abnormal; Notable for the following components:      Result Value   Glucose, Bld 156 (*)    Calcium 8.6 (*)    Total Bilirubin 0.2 (*)    All other components within normal limits  CBC  TROPONIN I  LIPASE, BLOOD  TROPONIN I    Course of Care:   Physical Exam Vitals signs and nursing note reviewed.  Constitutional:      General: He is not in acute distress.    Appearance: He is well-developed. He is not diaphoretic.     Comments: Sitting comfortably in bed.  HENT:     Head: Normocephalic and atraumatic.  Eyes:     General:        Right eye: No discharge.        Left eye: No discharge.     Conjunctiva/sclera: Conjunctivae normal.     Comments: EOMs normal to gross examination.  Neck:     Musculoskeletal: Normal range of motion.  Cardiovascular:     Rate and Rhythm: Normal rate and regular rhythm.     Comments: Intact, 2+ radial pulse. Abdominal:     General: There is no distension.     Tenderness: There is no guarding or rebound.     Comments: Epigastric to right upper quadrant pain without guarding or rebound.  Musculoskeletal: Normal range of motion.  Skin:    General: Skin is warm and dry.  Neurological:     Mental Status: He is alert.     Comments: Cranial nerves intact to gross observation. Patient moves extremities without  difficulty.  Psychiatric:        Behavior: Behavior normal.        Thought Content: Thought content normal.        Judgment: Judgment normal.     ED Course/Procedures     Procedures  MDM   Patient nontoxic-appearing with benign abdomen.  General surgery PA, Nehemiah Settle, alerted that patient is here and she will come to evaluate.  Appreciate their involvement in the care of this patient.       Delia Chimes 01/29/19 1359    Bethann Berkshire, MD 01/29/19 [phone]

## 2019-01-29 NOTE — ED Notes (Signed)
Pt been in CT , meds delayed.

## 2019-01-29 NOTE — H&P (Signed)
[facility] Admission Note  Alan Goodman December 05, 1965  [id number].    Requesting MD: Dr. Tilden Fossa Chief Complaint/Reason for Consult:   HPI: Alan Goodman is a 53 y.o. male who prsented to [facility] ED for adominal pain that awoke the patient around 1am this morning. The patient reports that his pain was mid-epigastric, described as a tightness, with radiation into his chest and associated nausea and NB/NB emesis. The patient reports similar symptoms over the last several months that he thought was related to acid reflux and was self medicating with Prilosec. He is unsure if these events were related to eating. He tried Prilosec with a few sips of water overnight without any relief. Patient denies associated fever, chills, sob, diarrhea or urinary symptoms. He had a large meal of [place] and potatoes the night before.   In the ED patients Temp 97.8, HR 59, RR 20, BP 132/73, O2 97% on RA. WBC 9.5. LFTs, Lipase and Bilirubin wnl. ACS and dissection was ruled out by EDP. CTA CAP shows large gallstone in the gallbladder near the gallbladder neck. There was no gallbladder wall thickening, pericholecystic fluid or biliary dilation. We were asked to consult.  Last meal 630AM, few sips of water Last BM yesterday at 430PM. No melena or hematochezia.  PMH: None, no daily medications. Denies chronic NSAID use. Abdominal surgical history: None Anticoagulants: None Previously smoked 3PPD, now Vapes.  Occasional alcohol use, 1x/month. No recent alcohol use. Employment: Engineer, drilling The patient lives at home with his wife  ROS: Review of Systems  All other systems reviewed and are negative.   All systems reviewed and otherwise negative except for as above  No family history on file.  History reviewed. No pertinent past medical history.  Past Surgical History:  Procedure Laterality Date  . INCISION AND DRAINAGE ABSCESS Left 03/31/2016   Procedure: INCISION AND DRAINAGE  OF LEFT INNER THIGH ABSCESS;  Surgeon: Chevis Pretty III, MD;  Location: [facility] ORS;  Service: General;  Laterality: Left;  . NECK SURGERY      Social History:  reports that he has quit smoking. He does not have any smokeless tobacco history on file. He reports that he does not drink alcohol or use drugs.  Allergies: No Known Allergies  (Not in a hospital admission)   Prior to Admission medications   Medication Sig Start Date End Date Taking? Authorizing Provider  doxycycline (VIBRA-TABS) 100 MG tablet Take 1 tablet (100 mg total) by mouth 2 (two) times daily. 04/02/16   Griselda Miner, MD  oxyCODONE-acetaminophen (ROXICET) 5-325 MG tablet Take 1-2 tablets by mouth every 4 (four) hours as needed for severe pain. 04/02/16   Chevis Pretty III, MD    Blood pressure 132/73, pulse (!) 59, temperature 97.8 F (36.6 C), temperature source Oral, resp. rate 20, height 5\' 9"  (1.753 m), weight 104.3 kg, SpO2 97 %. Physical Exam: General: pleasant, WD/WN white male who is laying in bed in NAD HEENT: head is normocephalic, atraumatic.  Sclera are noninjected.  Pupils equal, round and reactive to light.  Ears and nose without any masses or lesions.  Mouth is pink and moist. Dentition fair Heart: regular, rate, and rhythm.  No obvious murmurs, gallops, or rubs noted.  Palpable pedal pulses bilaterally Lungs: CTAB, no wheezes, rhonchi, or rales noted.  Respiratory effort nonlabored Abd: Protuberant, soft, ND, +BS with moderate tenderness of the epigastrium and mild tenderness of the RUQ. Voluntary guarding. No involuntary guarding, rebound, rigidity, gaurding or  signs of peritonitis. No masses, hernias, or organomegaly MS: all 4 extremities are symmetrical with no cyanosis, clubbing, or edema. Skin: warm and dry with no masses, lesions, or rashes Psych: A&Ox3 with an appropriate affect. Neuro: cranial nerves grossly intact, extremity CSM intact bilaterally, normal speech  Results for orders placed or performed  during the hospital encounter of 01/29/19 (from the past 48 hour(s))  CBC     Status: None   Collection Time: 01/29/19  8:01 AM  Result Value Ref Range   WBC 9.5 4.0 - 10.5 K/uL   RBC 4.93 4.22 - 5.81 MIL/uL   Hemoglobin 14.2 13.0 - 17.0 g/dL   HCT 16.1 09.6 - 04.5 %   MCV 90.3 80.0 - 100.0 fL   MCH 28.8 26.0 - 34.0 pg   MCHC 31.9 30.0 - 36.0 g/dL   RDW 40.9 81.1 - 91.4 %   Platelets 277 150 - 400 K/uL   nRBC 0.0 0.0 - 0.2 %    Comment: Performed at [facility], [address] [place], [place] [address]  Troponin I - ONCE - STAT     Status: None   Collection Time: 01/29/19  8:02 AM  Result Value Ref Range   Troponin I <0.03 <0.03 ng/mL    Comment: Performed at [facility], [address] [place], [place] [address]  Lipase, blood     Status: None   Collection Time: 01/29/19  8:02 AM  Result Value Ref Range   Lipase 29 11 - 51 U/L    Comment: Performed at [facility], [address] [place], [place] [address]  Comprehensive metabolic panel     Status: Abnormal   Collection Time: 01/29/19  8:02 AM  Result Value Ref Range   Sodium 136 135 - 145 mmol/L   Potassium 4.0 3.5 - 5.1 mmol/L   Chloride 105 98 - 111 mmol/L   CO2 25 22 - 32 mmol/L   Glucose, Bld 156 (H) 70 - 99 mg/dL   BUN 13 6 - 20 mg/dL   Creatinine, Ser 4.69 0.61 - 1.24 mg/dL   Calcium 8.6 (L) 8.9 - 10.3 mg/dL   Total Protein 7.0 6.5 - 8.1 g/dL   Albumin 3.9 3.5 - 5.0 g/dL   AST 21 15 - 41 U/L   ALT 21 0 - 44 U/L   Alkaline Phosphatase 82 38 - 126 U/L   Total Bilirubin 0.2 (L) 0.3 - 1.2 mg/dL   GFR calc non Af Amer >60 >60 mL/min   GFR calc Af Amer >60 >60 mL/min   Anion gap 6 5 - 15    Comment: Performed at [facility], [address] [place], [place] [address]  Troponin I - Once     Status: None   Collection Time: 01/29/19 12:07 PM  Result Value Ref Range   Troponin I <0.03 <0.03 ng/mL    Comment: Performed at [facility], [address]  [place], [place] [address]   Dg Chest 2 View  Result Date: 01/29/2019 CLINICAL DATA:  Chest pain.  Epigastric pain and vomiting. EXAM: CHEST - 2 VIEW COMPARISON:  None. FINDINGS: The cardiomediastinal silhouette is within normal limits. The lungs are well inflated and clear. There is no evidence of pleural effusion or pneumothorax. No acute osseous abnormality is identified. Prior cervical spine fusion is noted. IMPRESSION: No active cardiopulmonary disease. Electronically Signed   By: Jolaine Click.D.  On: 01/29/2019 07:59   Ct Angio Chest/abd/pel For Dissection W And/or W/wo  Result Date: 01/29/2019 CLINICAL DATA:  Chest and back pain, rule out aortic dissection EXAM: CT ANGIOGRAPHY CHEST, ABDOMEN AND PELVIS TECHNIQUE: Multidetector CT imaging through the chest, abdomen and pelvis was performed both prior to and following the intravenous administration of contrast using the standard protocol during bolus administration of intravenous contrast. Multiplanar reconstructed images and MIPs were obtained and reviewed to evaluate the vascular anatomy. CONTRAST:  ISOVUE-370 IOPAMIDOL (ISOVUE-370) INJECTION 76% COMPARISON:  Same day chest radiograph FINDINGS: CTA CHEST FINDINGS Cardiovascular: Satisfactory opacification of the pulmonary arteries to the segmental level. No evidence of pulmonary embolism. Normal heart size. Minimal left coronary artery calcifications. No pericardial effusion. Mediastinum/Nodes: No enlarged mediastinal, hilar, or axillary lymph nodes. Thyroid gland, trachea, and esophagus demonstrate no significant findings. Lungs/Pleura: Lungs are clear. No pleural effusion or pneumothorax. Upper Abdomen: No acute abnormality. Musculoskeletal: No chest wall abnormality. No acute or significant osseous findings. Disc degenerative disease of the thoracic spine. Review of the MIP images confirms the above findings. CTA ABDOMEN AND PELVIS FINDINGS VASCULAR Aorta: Normal caliber aorta without aneurysm,  dissection, vasculitis or significant stenosis. Minimal atherosclerosis of the inferior aorta. Celiac: Patent without evidence of aneurysm, dissection, vasculitis or significant stenosis. SMA: Patent without evidence of aneurysm, dissection, vasculitis or significant stenosis. Renals: Both renal arteries are patent without evidence of aneurysm, dissection, vasculitis, fibromuscular dysplasia or significant stenosis. IMA: Patent without evidence of aneurysm, dissection, vasculitis or significant stenosis. Inflow: Patent without evidence of aneurysm, dissection, vasculitis or significant stenosis. Veins: No obvious venous abnormality within the limitations of this arterial phase study. Review of the MIP images confirms the above findings. NON-VASCULAR Lower chest: No acute abnormality. Hepatobiliary: No focal liver abnormality is seen. Large gallstone in the gallbladder near the gallbladder neck. No gallbladder wall thickening, or biliary dilatation. Pancreas: Unremarkable. No pancreatic ductal dilatation or surrounding inflammatory changes. Spleen: Normal in size without focal abnormality. Adrenals/Urinary Tract: Adrenal glands are unremarkable. Kidneys are normal, without renal calculi, focal lesion, or hydronephrosis. Bladder is unremarkable. Stomach/Bowel: Stomach is within normal limits. Appendix appears normal. No evidence of bowel wall thickening, distention, or inflammatory changes. Vascular/Lymphatic: No significant vascular findings are present. No enlarged abdominal or pelvic lymph nodes. Reproductive: No mass or other abnormality. Other: No abdominal wall hernia or abnormality. No abdominopelvic ascites. Musculoskeletal: No acute or significant osseous findings. Review of the MIP images confirms the above findings. IMPRESSION: 1. No evidence of aortic dissection, aneurysm, or other acute aortic syndrome. Minimal atherosclerosis of the abdominal aorta. 2. Large gallstone in the gallbladder near the  gallbladder neck. No gallbladder wall thickening or biliary dilatation. No pericholecystic fluid. Electronically Signed   By: Lauralyn Primes M.D.   On: 01/29/2019 11:06      Assessment/Plan Symptomatic Cholelithiasis  - Patient with large stone near gallbladder neck on CT. No current evidence of cholecystitis but patient remains symptomatic. Will plan for Laparoscopic Cholecystectomy +/- IOC tomorrow morning. Will admit to med-surg for pain control and start on IV Rocephin. Patient will be started okay for clear diet today and made NPO after midnight. I have explained the procedure, risks, and aftercare of cholecystectomy.  Risks include but are not limited to bleeding, infection, wound problems, anesthesia, diarrhea, bile leak, injury to common bile duct/liver/intestine. We also discussed there is a chance that this may not relieve the patient's symptoms.  He seems to understand and agrees to proceed.   ID - Rocephin VTE -  SCDs, Lovenox, Mobilize FEN - Clears, NPO after midnight Foley - None Follow up - TBD  Jacinto Halim, [facility] Surgery 01/29/2019, 3:10 PM Pager: [phone]

## 2019-01-29 NOTE — ED Triage Notes (Signed)
Pt reports epigastric pain, nausea and emesis x 1 day , Hx GERD, no pain radiation. Describes pain as crushing and pressure. Alert and oriented x 4.

## 2019-01-30 ENCOUNTER — Observation Stay (HOSPITAL_COMMUNITY): Payer: Self-pay | Admitting: Anesthesiology

## 2019-01-30 ENCOUNTER — Encounter (HOSPITAL_COMMUNITY): Payer: Self-pay | Admitting: *Deleted

## 2019-01-30 ENCOUNTER — Encounter (HOSPITAL_COMMUNITY)
Admission: EM | Disposition: A | Discharge status: Home / Self Care | Payer: Self-pay | Source: Home / Self Care | Attending: Emergency Medicine

## 2019-01-30 HISTORY — PX: CHOLECYSTECTOMY: SHX55

## 2019-01-30 LAB — CBC
HCT: 41.9 % (ref 39.0–52.0)
Hemoglobin: 13 g/dL (ref 13.0–17.0)
MCH: 28.8 pg (ref 26.0–34.0)
MCHC: 31 g/dL (ref 30.0–36.0)
MCV: 92.7 fL (ref 80.0–100.0)
Platelets: 235 10*3/uL (ref 150–400)
RBC: 4.52 MIL/uL (ref 4.22–5.81)
RDW: 13.8 % (ref 11.5–15.5)
WBC: 7.6 10*3/uL (ref 4.0–10.5)
nRBC: 0 % (ref 0.0–0.2)

## 2019-01-30 LAB — COMPREHENSIVE METABOLIC PANEL
ALBUMIN: 3.2 g/dL — AB (ref 3.5–5.0)
ALK PHOS: 67 U/L (ref 38–126)
ALT: 17 U/L (ref 0–44)
ANION GAP: 8 (ref 5–15)
AST: 18 U/L (ref 15–41)
BUN: 7 mg/dL (ref 6–20)
CO2: 22 mmol/L (ref 22–32)
Calcium: 7.8 mg/dL — ABNORMAL LOW (ref 8.9–10.3)
Chloride: 109 mmol/L (ref 98–111)
Creatinine, Ser: 1.03 mg/dL (ref 0.61–1.24)
GFR calc Af Amer: >60 mL/min (ref >60)
GFR calc non Af Amer: >60 mL/min (ref >60)
GLUCOSE: 98 mg/dL (ref 70–99)
Potassium: 3.9 mmol/L (ref 3.5–5.1)
SODIUM: 139 mmol/L (ref 135–145)
Total Bilirubin: 0.6 mg/dL (ref 0.3–1.2)
Total Protein: 6.3 g/dL — ABNORMAL LOW (ref 6.5–8.1)

## 2019-01-30 LAB — HIV ANTIBODY (ROUTINE TESTING W REFLEX): HIV Screen 4th Generation wRfx: NONREACTIVE

## 2019-01-30 SURGERY — LAPAROSCOPIC CHOLECYSTECTOMY WITH INTRAOPERATIVE CHOLANGIOGRAM
Anesthesia: General

## 2019-01-30 MED ORDER — PROPOFOL 10 MG/ML IV BOLUS
INTRAVENOUS | Status: DC | PRN
  Administered 2019-01-30: 200 mg via INTRAVENOUS

## 2019-01-30 MED ORDER — LIDOCAINE 20MG/ML (2%) 15 ML SYRINGE OPTIME
INTRAMUSCULAR | Status: DC | PRN
  Administered 2019-01-30: 1.5 mg/kg/h via INTRAVENOUS

## 2019-01-30 MED ORDER — OXYCODONE HCL 5 MG PO TABS: 5.0000 mg | ORAL_TABLET | Freq: Four times a day (QID) | ORAL | 0 refills | Status: DC | PRN

## 2019-01-30 MED ORDER — DOCUSATE SODIUM 100 MG PO CAPS: 100.0000 mg | ORAL_CAPSULE | Freq: Two times a day (BID) | ORAL | 0 refills | Status: DC | PRN

## 2019-01-30 MED ORDER — BUPIVACAINE HCL (PF) 0.25 % IJ SOLN
INTRAMUSCULAR | Status: DC | PRN
  Administered 2019-01-30: 30 mL

## 2019-01-30 MED ORDER — ONDANSETRON HCL 4 MG/2ML IJ SOLN: 4.0000 mg | Freq: Once | INTRAMUSCULAR | Status: DC | PRN

## 2019-01-30 MED ORDER — OXYCODONE HCL 5 MG/5ML PO SOLN: 5.0000 mg | Freq: Once | ORAL | Status: DC | PRN

## 2019-01-30 MED ORDER — FENTANYL CITRATE (PF) 250 MCG/5ML IJ SOLN
INTRAMUSCULAR | Status: AC
  Filled 2019-01-30: qty 5

## 2019-01-30 MED ORDER — LIDOCAINE 2% (20 MG/ML) 5 ML SYRINGE
INTRAMUSCULAR | Status: DC | PRN
  Administered 2019-01-30: 50 mg via INTRAVENOUS

## 2019-01-30 MED ORDER — LACTATED RINGERS IR SOLN
Status: DC | PRN
  Administered 2019-01-30: 1000 mL

## 2019-01-30 MED ORDER — MIDAZOLAM HCL 2 MG/2ML IJ SOLN
INTRAMUSCULAR | Status: AC
  Filled 2019-01-30: qty 2

## 2019-01-30 MED ORDER — ONDANSETRON HCL 4 MG/2ML IJ SOLN
INTRAMUSCULAR | Status: DC | PRN
  Administered 2019-01-30: 4 mg via INTRAVENOUS

## 2019-01-30 MED ORDER — LIDOCAINE 2% (20 MG/ML) 5 ML SYRINGE
INTRAMUSCULAR | Status: AC
  Filled 2019-01-30: qty 5

## 2019-01-30 MED ORDER — FENTANYL CITRATE (PF) 100 MCG/2ML IJ SOLN
INTRAMUSCULAR | Status: DC | PRN
  Administered 2019-01-30: 50 ug via INTRAVENOUS
  Administered 2019-01-30: 100 ug via INTRAVENOUS
  Administered 2019-01-30: 50 ug via INTRAVENOUS

## 2019-01-30 MED ORDER — LACTATED RINGERS IV SOLN
INTRAVENOUS | Status: DC
  Administered 2019-01-30: 11:00:00 via INTRAVENOUS

## 2019-01-30 MED ORDER — POLYETHYLENE GLYCOL 3350 17 G PO PACK: 17.0000 g | PACK | Freq: Every day | ORAL | 0 refills | Status: DC | PRN

## 2019-01-30 MED ORDER — SUGAMMADEX SODIUM 200 MG/2ML IV SOLN
INTRAVENOUS | Status: DC | PRN
  Administered 2019-01-30: 225 mg via INTRAVENOUS

## 2019-01-30 MED ORDER — ROCURONIUM BROMIDE 100 MG/10ML IV SOLN
INTRAVENOUS | Status: AC
  Filled 2019-01-30: qty 1

## 2019-01-30 MED ORDER — ACETAMINOPHEN 325 MG PO TABS: 650.0000 mg | ORAL_TABLET | Freq: Four times a day (QID) | ORAL | 0 refills | Status: AC | PRN

## 2019-01-30 MED ORDER — PROPOFOL 10 MG/ML IV BOLUS
INTRAVENOUS | Status: AC
  Filled 2019-01-30: qty 20

## 2019-01-30 MED ORDER — FENTANYL CITRATE (PF) 100 MCG/2ML IJ SOLN
INTRAMUSCULAR | Status: AC
  Filled 2019-01-30: qty 2

## 2019-01-30 MED ORDER — ONDANSETRON HCL 4 MG/2ML IJ SOLN
INTRAMUSCULAR | Status: AC
  Filled 2019-01-30: qty 2

## 2019-01-30 MED ORDER — BUPIVACAINE HCL (PF) 0.25 % IJ SOLN
INTRAMUSCULAR | Status: AC
  Filled 2019-01-30: qty 30

## 2019-01-30 MED ORDER — DEXAMETHASONE SODIUM PHOSPHATE 10 MG/ML IJ SOLN
INTRAMUSCULAR | Status: DC | PRN
  Administered 2019-01-30: 10 mg via INTRAVENOUS

## 2019-01-30 MED ORDER — DEXAMETHASONE SODIUM PHOSPHATE 10 MG/ML IJ SOLN
INTRAMUSCULAR | Status: AC
  Filled 2019-01-30: qty 1

## 2019-01-30 MED ORDER — OXYCODONE HCL 5 MG PO TABS: 5.0000 mg | ORAL_TABLET | Freq: Once | ORAL | Status: DC | PRN

## 2019-01-30 MED ORDER — 0.9 % SODIUM CHLORIDE (POUR BTL) OPTIME
TOPICAL | Status: DC | PRN
  Administered 2019-01-30: 1000 mL

## 2019-01-30 MED ORDER — ROCURONIUM BROMIDE 50 MG/5ML IV SOSY
PREFILLED_SYRINGE | INTRAVENOUS | Status: DC | PRN
  Administered 2019-01-30: 50 mg via INTRAVENOUS

## 2019-01-30 MED ORDER — SUGAMMADEX SODIUM 500 MG/5ML IV SOLN
INTRAVENOUS | Status: AC
  Filled 2019-01-30: qty 5

## 2019-01-30 MED ORDER — MIDAZOLAM HCL 5 MG/5ML IJ SOLN
INTRAMUSCULAR | Status: DC | PRN
  Administered 2019-01-30: 2 mg via INTRAVENOUS

## 2019-01-30 MED ORDER — FENTANYL CITRATE (PF) 100 MCG/2ML IJ SOLN
25.0000 ug | INTRAMUSCULAR | Status: DC | PRN
  Administered 2019-01-30: 50 ug via INTRAVENOUS

## 2019-01-30 MED ORDER — LIDOCAINE HCL 2 % IJ SOLN
INTRAMUSCULAR | Status: AC
  Filled 2019-01-30: qty 20

## 2019-01-30 SURGICAL SUPPLY — 40 items
ADH SKN CLS APL DERMABOND .7 (GAUZE/BANDAGES/DRESSINGS) ×1
APPLIER CLIP 5 13 M/L LIGAMAX5 (MISCELLANEOUS) ×3
APR CLP MED LRG 5 ANG JAW (MISCELLANEOUS) ×1
BAG SPEC RTRVL LRG 6X4 10 (ENDOMECHANICALS) ×1
CABLE HIGH FREQUENCY MONO STRZ (ELECTRODE) ×3 IMPLANT
CHLORAPREP W/TINT 26ML (MISCELLANEOUS) ×3 IMPLANT
CLIP APPLIE 5 13 M/L LIGAMAX5 (MISCELLANEOUS) ×1 IMPLANT
COVER MAYO STAND STRL (DRAPES) IMPLANT
COVER WAND RF STERILE (DRAPES) IMPLANT
DERMABOND ADVANCED (GAUZE/BANDAGES/DRESSINGS) ×2
DERMABOND ADVANCED .7 DNX12 (GAUZE/BANDAGES/DRESSINGS) ×1 IMPLANT
DEVICE TROCAR PUNCTURE CLOSURE (ENDOMECHANICALS) IMPLANT
DRAPE C-ARM 42X120 X-RAY (DRAPES) IMPLANT
ELECT REM PT RETURN 15FT ADLT (MISCELLANEOUS) ×3 IMPLANT
GLOVE BIO SURGEON STRL SZ 6.5 (GLOVE) ×2 IMPLANT
GLOVE BIO SURGEONS STRL SZ 6.5 (GLOVE) ×1
GLOVE BIOGEL PI IND STRL 7.0 (GLOVE) ×1 IMPLANT
GLOVE BIOGEL PI INDICATOR 7.0 (GLOVE) ×2
GOWN STRL REUS W/TWL 2XL LVL3 (GOWN DISPOSABLE) ×3 IMPLANT
GOWN STRL REUS W/TWL XL LVL3 (GOWN DISPOSABLE) ×6 IMPLANT
IRRIG SUCT STRYKERFLOW 2 WTIP (MISCELLANEOUS) ×3
IRRIGATION SUCT STRKRFLW 2 WTP (MISCELLANEOUS) ×1 IMPLANT
IV CATH 14GX2 1/4 (CATHETERS) IMPLANT
KIT BASIN OR (CUSTOM PROCEDURE TRAY) ×3 IMPLANT
POUCH SPECIMEN RETRIEVAL 10MM (ENDOMECHANICALS) ×3 IMPLANT
SCISSORS LAP 5X35 DISP (ENDOMECHANICALS) ×3 IMPLANT
SET CHOLANGIOGRAPH MIX (MISCELLANEOUS) IMPLANT
SET TUBE SMOKE EVAC HIGH FLOW (TUBING) ×3 IMPLANT
SLEEVE XCEL OPT CAN 5 100 (ENDOMECHANICALS) ×6 IMPLANT
SUT VIC AB 2-0 SH 27 (SUTURE)
SUT VIC AB 2-0 SH 27X BRD (SUTURE) IMPLANT
SUT VIC AB 4-0 PS2 18 (SUTURE) ×3 IMPLANT
SUT VIC AB 4-0 PS2 27 (SUTURE) ×2 IMPLANT
SUT VICRYL 0 UR6 27IN ABS (SUTURE) IMPLANT
TOWEL OR 17X26 10 PK STRL BLUE (TOWEL DISPOSABLE) ×3 IMPLANT
TOWEL OR NON WOVEN STRL DISP B (DISPOSABLE) ×3 IMPLANT
TRAY LAPAROSCOPIC (CUSTOM PROCEDURE TRAY) ×3 IMPLANT
TROCAR BLADELESS OPT 5 100 (ENDOMECHANICALS) ×3 IMPLANT
TROCAR XCEL BLUNT TIP 100MML (ENDOMECHANICALS) IMPLANT
TROCAR XCEL NON-BLD 11X100MML (ENDOMECHANICALS) IMPLANT

## 2019-01-30 NOTE — Progress Notes (Signed)
Subjective: No new complaints  Objective: Vital signs in last 24 hours: Temp:  [97.5 F (36.4 C)-97.9 F (36.6 C)] 97.7 F (36.5 C) (02/27 1037) Pulse Rate:  [57-105] 59 (02/27 1037) Resp:  [16-26] 18 (02/27 1037) BP: (103-146)/(68-99) 143/83 (02/27 1037) SpO2:  [95 %-99 %] 97 % (02/27 1037) Weight:  [104.3 kg] 104.3 kg (02/26 1357) Last BM Date: 01/28/19  Intake/Output from previous day: 02/26 0701 - 02/27 0700 In: 2010 [P.O.:600; I.V.:1310; IV Piggyback:100] Out: 1220 [Urine:1220] Intake/Output this shift: Total I/O In: 430.1 [I.V.:430.1] Out: 300 [Urine:300]  General appearance: alert and cooperative GI: soft  Lab Results:  Results for orders placed or performed during the hospital encounter of 01/29/19 (from the past 24 hour(s))  Troponin I - Once     Status: None   Collection Time: 01/29/19 12:07 PM  Result Value Ref Range   Troponin I <0.03 <0.03 ng/mL  Surgical pcr screen     Status: None   Collection Time: 01/29/19  5:09 PM  Result Value Ref Range   MRSA, PCR NEGATIVE NEGATIVE   Staphylococcus aureus NEGATIVE NEGATIVE  Comprehensive metabolic panel     Status: Abnormal   Collection Time: 01/30/19  4:20 AM  Result Value Ref Range   Sodium 139 135 - 145 mmol/L   Potassium 3.9 3.5 - 5.1 mmol/L   Chloride 109 98 - 111 mmol/L   CO2 22 22 - 32 mmol/L   Glucose, Bld 98 70 - 99 mg/dL   BUN 7 6 - 20 mg/dL   Creatinine, Ser 5.61 0.61 - 1.24 mg/dL   Calcium 7.8 (L) 8.9 - 10.3 mg/dL   Total Protein 6.3 (L) 6.5 - 8.1 g/dL   Albumin 3.2 (L) 3.5 - 5.0 g/dL   AST 18 15 - 41 U/L   ALT 17 0 - 44 U/L   Alkaline Phosphatase 67 38 - 126 U/L   Total Bilirubin 0.6 0.3 - 1.2 mg/dL   GFR calc non Af Amer >60 >60 mL/min   GFR calc Af Amer >60 >60 mL/min   Anion gap 8 5 - 15  CBC     Status: None   Collection Time: 01/30/19  4:20 AM  Result Value Ref Range   WBC 7.6 4.0 - 10.5 K/uL   RBC 4.52 4.22 - 5.81 MIL/uL   Hemoglobin 13.0 13.0 - 17.0 g/dL   HCT 53.7 94.3 -  27.6 %   MCV 92.7 80.0 - 100.0 fL   MCH 28.8 26.0 - 34.0 pg   MCHC 31.0 30.0 - 36.0 g/dL   RDW 14.7 09.2 - 95.7 %   Platelets 235 150 - 400 K/uL   nRBC 0.0 0.0 - 0.2 %     Studies/Results Radiology     MEDS, Scheduled . Chlorhexidine Gluconate Cloth  6 each Topical Once  . [MAR Hold] docusate sodium  100 mg Oral BID  . [MAR Hold] enoxaparin (LOVENOX) injection  40 mg Subcutaneous Q24H  . [MAR Hold] pantoprazole (PROTONIX) IV  40 mg Intravenous QHS     Assessment: Cholelithiasis   Plan: The anatomy & physiology of hepatobiliary & pancreatic function was discussed.  The pathophysiology of gallbladder dysfunction was discussed.  Natural history risks without surgery was discussed.   I feel the risks of no intervention will lead to serious problems that outweigh the operative risks; therefore, I recommended cholecystectomy to remove the pathology.  I explained laparoscopic techniques with possible need for an open approach.  Probable cholangiogram to evaluate the  bilary tract was explained as well.    Risks such as bleeding, infection, abscess, leak, injury to other organs, need for further treatment, heart attack, death, and other risks were discussed.  I noted a good likelihood this will help address the problem.  Possibility that this will not correct all abdominal symptoms was explained.  Goals of post-operative recovery were discussed as well.  We will work to minimize complications.  An educational handout further explaining the pathology and treatment options was given as well.  Questions were answered.  The patient expresses understanding & wishes to proceed with surgery. '  LOS: 0 days    Vanita Panda, MD [facility], [place] [phone]   01/30/2019 11:08 AM

## 2019-01-30 NOTE — Discharge Instructions (Signed)
[facility] ° °Please arrive at least 30 min before your appointment to complete your check in paperwork.  If you are unable to arrive 30 min prior to your appointment time we may have to cancel or reschedule you. °LAPAROSCOPIC SURGERY: POST OP INSTRUCTIONS °Always review your discharge instruction sheet given to you by the facility where your surgery was performed. °IF YOU HAVE DISABILITY OR FAMILY LEAVE FORMS, YOU MUST BRING THEM TO THE OFFICE FOR PROCESSING.   °DO NOT GIVE THEM TO YOUR DOCTOR. ° °PAIN CONTROL ° °1. First take acetaminophen (Tylenol) AND/or ibuprofen (Advil) to control your pain after surgery.  Follow directions on package.  Taking acetaminophen (Tylenol) and/or ibuprofen (Advil) regularly after surgery will help to control your pain and lower the amount of prescription pain medication you may need.  You should not take more than 4,000 mg (4 grams) of acetaminophen (Tylenol) in 24 hours.  You should not take ibuprofen (Advil), aleve, motrin, naprosyn or other NSAIDS if you have a history of stomach ulcers or chronic kidney disease.  °2. A prescription for pain medication may be given to you upon discharge.  Take your pain medication as prescribed, if you still have uncontrolled pain after taking acetaminophen (Tylenol) or ibuprofen (Advil). °3. Use ice packs to help control pain. °4. If you need a refill on your pain medication, please contact your pharmacy.  They will contact our office to request authorization. Prescriptions will not be filled after 5pm or on week-ends. ° °HOME MEDICATIONS °5. Take your usually prescribed medications unless otherwise directed. ° °DIET °6. You should follow a light diet the first few days after arrival home.  Be sure to include lots of fluids daily. Avoid fatty, fried foods.  ° °CONSTIPATION °7. It is common to experience some constipation after surgery and if you are taking pain medication.  Increasing fluid intake and taking a stool  softener (such as Colace) will usually help or prevent this problem from occurring.  A mild laxative (Milk of Magnesia or Miralax) should be taken according to package instructions if there are no bowel movements after 48 hours. ° °WOUND/INCISION CARE °8. Most patients will experience some swelling and bruising in the area of the incisions.  Ice packs will help.  Swelling and bruising can take several days to resolve.  °9. Unless discharge instructions indicate otherwise, follow guidelines below  °a. STERI-STRIPS - you may remove your outer bandages 48 hours after surgery, and you may shower at that time.  You have steri-strips (small skin tapes) in place directly over the incision.  These strips should be left on the skin for 7-10 days.   °b. DERMABOND/SKIN GLUE - you may shower in 24 hours.  The glue will flake off over the next 2-3 weeks. °10. Any sutures or staples will be removed at the office during your follow-up visit. ° °ACTIVITIES °11. You may resume regular (light) daily activities beginning the next day--such as daily self-care, walking, climbing stairs--gradually increasing activities as tolerated.  You may have sexual intercourse when it is comfortable.  Refrain from any heavy lifting or straining until approved by your doctor. °a. You may drive when you are no longer taking prescription pain medication, you can comfortably wear a seatbelt, and you can safely maneuver your car and apply brakes. ° °FOLLOW-UP °12. You should see your doctor in the office for a follow-up appointment approximately 2-3 weeks after your surgery.  You should have been given your post-op/follow-up appointment when   your surgery was scheduled.  If you did not receive a post-op/follow-up appointment, make sure that you call for this appointment within a day or two after you arrive home to insure a convenient appointment time.   WHEN TO CALL YOUR DOCTOR: 1. Fever over 101.0 2. Inability to urinate 3. Continued bleeding from  incision. 4. Increased pain, redness, or drainage from the incision. 5. Increasing abdominal pain  The clinic staff is available to answer your questions during regular business hours.  Please dont hesitate to call and ask to speak to one of the nurses for clinical concerns.  If you have a medical emergency, go to the nearest emergency room or call 911.  A surgeon from [facility] is always on call at the hospital. [address], [place], [place]  [address] ? [address] [address], [place], [place]   [address] [phone] ? [phone] ? FAX [phone]   Gallbladder Eating Plan If you have a gallbladder condition, you may have trouble digesting fats. Eating a low-fat diet can help reduce your symptoms, and may be helpful before and after having surgery to remove your gallbladder (cholecystectomy). Your health care provider may recommend that you work with a diet and nutrition specialist (dietitian) to help you reduce the amount of fat in your diet. What are tips for following this plan? General guidelines  Limit your fat intake to less than 30% of your total daily calories. If you eat around 1,800 calories each day, this is less than 60 grams (g) of fat per day.  Fat is an important part of a healthy diet. Eating a low-fat diet can make it hard to maintain a healthy body weight. Ask your dietitian how much fat, calories, and other nutrients you need each day.  Eat small, frequent meals throughout the day instead of three large meals.  Drink at least 8-10 cups of fluid a day. Drink enough fluid to keep your urine clear or pale yellow.  Limit alcohol intake to no more than 1 drink a day for nonpregnant women and 2 drinks a day for men. One drink equals 12 oz of beer, 5 oz of wine, or 1 oz of hard liquor. Reading food labels  Check Nutrition Facts on food labels for the amount of fat per serving. Choose foods with less than 3 grams of fat per  serving. Shopping  Choose nonfat and low-fat healthy foods. Look for the words nonfat, low fat, or fat free.  Avoid buying processed or prepackaged foods. Cooking  Cook using low-fat methods, such as baking, broiling, grilling, or boiling.  Cook with small amounts of healthy fats, such as olive oil, grapeseed oil, canola oil, or sunflower oil. What foods are recommended?  All fresh, frozen, or canned fruits and vegetables.  Whole grains.  Low-fat or non-fat (skim) milk and yogurt.  Lean meat, skinless poultry, fish, eggs, and beans.  Low-fat protein supplement powders or drinks.  Spices and herbs. What foods are not recommended?  High-fat foods. These include baked goods, fast food, fatty cuts of meat, ice cream, french toast, sweet rolls, pizza, cheese bread, foods covered with butter, creamy sauces, or cheese.  Fried foods. These include french fries, tempura, battered fish, breaded chicken, fried breads, and sweets.  Foods with strong odors.  Foods that cause bloating and gas. Summary  A low-fat diet can be helpful if you have a gallbladder condition, or before and after gallbladder surgery.  Limit your fat intake to less than  30% of your total daily calories. This is about 60 g of fat if you eat 1,800 calories each day.  Eat small, frequent meals throughout the day instead of three large meals. This information is not intended to replace advice given to you by your health care provider. Make sure you discuss any questions you have with your health care provider. Document Released: 11/25/2013 Document Revised: 12/28/2016 Document Reviewed: 12/28/2016 Elsevier Interactive Patient Education  2019 [employer].

## 2019-01-30 NOTE — Anesthesia Preprocedure Evaluation (Signed)
Anesthesia Evaluation  Patient identified by MRN, date of birth, ID band Patient awake    Reviewed: Allergy & Precautions, NPO status , Patient's Chart, lab work & pertinent test results  History of Anesthesia Complications Negative for: history of anesthetic complications  Airway Mallampati: II  TM Distance: >3 FB Neck ROM: Full    Dental  (+) Edentulous Upper, Edentulous Lower   Pulmonary neg pulmonary ROS, former smoker,    Pulmonary exam normal breath sounds clear to auscultation       Cardiovascular negative cardio ROS Normal cardiovascular exam Rhythm:Regular Rate:Normal     Neuro/Psych negative neurological ROS  negative psych ROS   GI/Hepatic Neg liver ROS, GERD  ,  Endo/Other  negative endocrine ROS  Renal/GU negative Renal ROS  negative genitourinary   Musculoskeletal negative musculoskeletal ROS (+)   Abdominal   Peds  Hematology negative hematology ROS (+)   Anesthesia Other Findings   Reproductive/Obstetrics                            Anesthesia Physical Anesthesia Plan  ASA: II  Anesthesia Plan: General   Post-op Pain Management:    Induction: Intravenous  PONV Risk Score and Plan: 3 and Dexamethasone, Midazolam, Treatment may vary due to age or medical condition and Ondansetron  Airway Management Planned: Oral ETT  Additional Equipment: None  Intra-op Plan:   Post-operative Plan: Extubation in OR  Informed Consent: I have reviewed the patients History and Physical, chart, labs and discussed the procedure including the risks, benefits and alternatives for the proposed anesthesia with the patient or authorized representative who has indicated his/her understanding and acceptance.     Dental advisory given  Plan Discussed with:   Anesthesia Plan Comments:         Anesthesia Quick Evaluation

## 2019-01-30 NOTE — Transfer of Care (Signed)
Immediate Anesthesia Transfer of Care Note  Patient: Alan Goodman  Procedure(s) Performed: Procedure(s): LAPAROSCOPIC CHOLECYSTECTOMY (N/A)  Patient Location: PACU  Anesthesia Type:General  Level of Consciousness:  sedated, patient cooperative and responds to stimulation  Airway & Oxygen Therapy:Patient Spontanous Breathing and Patient connected to face mask oxgen  Post-op Assessment:  Report given to PACU RN and Post -op Vital signs reviewed and stable  Post vital signs:  Reviewed and stable  Last Vitals:  Vitals:   01/30/19 1037 01/30/19 1300  BP: (!) 143/83 (!) 144/88  Pulse: (!) 59 (!) 56  Resp: 18 15  Temp: 36.5 C 36.5 C  SpO2: 97% 96%    Complications: No apparent anesthesia complications

## 2019-01-30 NOTE — Anesthesia Procedure Notes (Signed)
Procedure Name: Intubation Date/Time: 01/30/2019 11:52 AM Performed by: Anne Fu, CRNA Pre-anesthesia Checklist: Patient identified, Emergency Drugs available, Suction available, Patient being monitored and Timeout performed Patient Re-evaluated:Patient Re-evaluated prior to induction Oxygen Delivery Method: Circle system utilized Preoxygenation: Pre-oxygenation with 100% oxygen Induction Type: IV induction Ventilation: Mask ventilation without difficulty Laryngoscope Size: Mac and 4 Grade View: Grade I Tube type: Oral Tube size: 7.5 mm Number of attempts: 1 Airway Equipment and Method: Video-laryngoscopy and Stylet Placement Confirmation: ETT inserted through vocal cords under direct vision,  positive ETCO2 and breath sounds checked- equal and bilateral Secured at: 21 cm Tube secured with: Tape Dental Injury: Teeth and Oropharynx as per pre-operative assessment  Comments: 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 Christella Hartigan.

## 2019-01-30 NOTE — Progress Notes (Signed)
Discharge instructions given to pt and all questions were answered.  

## 2019-01-30 NOTE — Anesthesia Postprocedure Evaluation (Signed)
Anesthesia Post Note  Patient: Alan Goodman  Procedure(s) Performed: LAPAROSCOPIC CHOLECYSTECTOMY (N/A )     Patient location during evaluation: PACU Anesthesia Type: General Level of consciousness: awake and alert Pain management: pain level controlled Vital Signs Assessment: post-procedure vital signs reviewed and stable Respiratory status: spontaneous breathing, nonlabored ventilation and respiratory function stable Cardiovascular status: blood pressure returned to baseline and stable Postop Assessment: no apparent nausea or vomiting Anesthetic complications: no    Last Vitals:  Vitals:   01/30/19 1507 01/30/19 1628  BP: (!) 155/76 (!) 157/95  Pulse: (!) 58 70  Resp: 16 16  Temp: 36.4 C 36.5 C  SpO2: 98% 97%    Last Pain:  Vitals:   01/30/19 1628  TempSrc: Oral  PainSc:                  [name]

## 2019-01-30 NOTE — Op Note (Signed)
01/30/2019  12:56 PM  PATIENT:  Alan Goodman  53 y.o. male  Patient Care Team: Patient, No Pcp Per as PCP - General (General Practice)  PRE-OPERATIVE DIAGNOSIS:  Symptomatic Cholelithiasis  POST-OPERATIVE DIAGNOSIS:  Symptomatic Cholelithiasis  PROCEDURE:  LAPAROSCOPIC CHOLECYSTECTOMY   Surgeon(s): Romie Levee, MD  ASSISTANT: Leary Roca PA   ANESTHESIA:   local and general  EBL: 25 ml  Total I/O In: 430.1 [I.V.:430.1] Out: 325 [Urine:300; Blood:25]  DRAINS: none   SPECIMEN:  Source of Specimen:  gallbladder  DISPOSITION OF SPECIMEN:  PATHOLOGY  COUNTS:  YES  PLAN OF CARE: Pt admitted  PATIENT DISPOSITION:  PACU - hemodynamically stable.  INDICATION: 53 y.o. M with symptomatic cholelithiasis  The anatomy & physiology of hepatobiliary & pancreatic function was discussed.  The pathophysiology of gallbladder dysfunction was discussed.  Natural history risks without surgery was discussed.   I feel the risks of no intervention will lead to serious problems that outweigh the operative risks; therefore, I recommended cholecystectomy to remove the pathology.  I explained laparoscopic techniques with possible need for an open approach.  Probable cholangiogram to evaluate the bilary tract was explained as well.    Risks such as bleeding, infection, abscess, leak, injury to other organs, need for further treatment, heart attack, death, and other risks were discussed.  I noted a good likelihood this will help address the problem.  Possibility that this will not correct all abdominal symptoms was explained.  Goals of post-operative recovery were discussed as well.    OR FINDINGS: mild cholecystitis  DESCRIPTION:   The patient was identified & brought into the operating room. The patient was positioned supine with arms tucked. SCDs were active during the entire case. The patient underwent general anesthesia without any difficulty.  The abdomen was prepped and draped in a  sterile fashion. A Surgical Timeout was performed and confirmed our plan.  We positioned the patient in reverse [place] & right side up.  I placed a Hassan laparoscopic port through the umbilicus using open entry technique.  Entry was clean. There were no adhesions to the anterior abdominal wall supraumbilically.  We induced carbon dioxide insufflation. Camera inspection revealed no injury.    I proceeded to continue with laparoscopic technique. I placed a 5 mm port in mid subcostal region, another 66mm port in the right flank near the anterior axillary line, and a 48mm port in the left subxiphoid region obliquely within the falciform ligament.  I turned attention to the right upper quadrant.   The gallbladder fundus was elevated cephalad. I used cautery and blunt dissection to free the peritoneal coverings between the gallbladder and the liver on the posteriolateral and anteriomedial walls.   I used careful blunt and cautery dissection with a maryland dissector to help get a good critical view of the cystic artery and cystic duct. I did further dissection to free a few centimeters of the  gallbladder off the liver bed to get a good critical view of the infundibulum and cystic duct. I mobilized the cystic artery.  I skeletonized the cystic duct.  After getting a good 360 view, not to perform a cholangiogram.  I placed a clip on the infundibulum.  I placed clips on the cystic duct x3.  I completed cystic duct transection.   I placed clips on the cystic artery x3 with 2 proximally.  I ligated the cystic artery using scissors. I freed the gallbladder from its remaining attachments to the liver. I ensured hemostasis on  the gallbladder fossa of the liver and elsewhere. I inspected the rest of the abdomen & detected no injury nor bleeding elsewhere.  I irrigated the RUQ with normal saline.  I removed the gallbladder through the umbilical port site.  I closed the umbilical fascia using 0 Vicryl stitches.   I  closed the skin using 4-0 vicryl stitch.  Sterile dressings were applied. The patient was extubated & arrived in the PACU in stable condition.  I had discussed postoperative care with the patient in the holding area.   I will discuss  operative findings and postoperative goals / instructions with the patient's family.  Instructions are written in the chart as well.

## 2019-01-31 ENCOUNTER — Encounter (HOSPITAL_COMMUNITY): Payer: Self-pay | Admitting: General Surgery

## 2019-01-31 NOTE — Discharge Summary (Signed)
    Patient ID: Alan Goodman [id number] 08/21/1966 53 y.o.  Admit date: 01/29/2019 Discharge date: 01/30/2019  Admitting Diagnosis: Symptomatic Cholelithiasis   Discharge Diagnosis Patient Active Problem List   Diagnosis Date Noted  . Symptomatic cholelithiasis 01/29/2019  . Abscess of left groin 03/31/2016    Consultants None  Reason for Admission: Alan Goodman is a 53 y.o. male who presented to [facility] ED on 2/26 for mid-epigastric adominal pain with N/V that awoke him from his sleep. He reported similar symptoms over the last several months that he thought was related to acid reflux and was self medicating with Prilosec. In the ED patients Temp 97.8, HR 59, RR 20, BP 132/73, O2 97% on RA. WBC 9.5. LFTs, Lipase and Bilirubin wnl. ACS and dissection was ruled out by EDP. CTA CAP shows large gallstone in the gallbladder near the gallbladder neck. There was no gallbladder wall thickening, pericholecystic fluid or biliary dilation. General surgery was asked to consult.   Procedures Laparoscopic Cholecystectomy   Hospital Course:  The patient was admitted and underwent a laparoscopic cholecystectomy.  The patient tolerated the procedure well.  On POD 0, the patient was tolerating a regular diet, voiding well, mobilizing, and pain was controlled with oral pain medications.  The patient was stable for DC home at this time with appropriate follow up made as below.  Physical Exam: Gen: Awake and alert, NAD Heart: RRR Lungs: CTA b/l, normal effort Abd: Soft, ND, appropriately tender, +BS. Incision c/d/i.  Msk: No edema  Allergies as of 01/30/2019   No Known Allergies     Medication List    STOP taking these medications   doxycycline 100 MG tablet Commonly known as:  VIBRA-TABS   oxyCODONE-acetaminophen 5-325 MG tablet Commonly known as:  ROXICET     TAKE these medications   acetaminophen 325 MG tablet Commonly known as:  TYLENOL Take 2 tablets (650 mg total) by mouth  every 6 (six) hours as needed for mild pain (or temp > 100).   docusate sodium 100 MG capsule Commonly known as:  COLACE Take 1 capsule (100 mg total) by mouth 2 (two) times daily as needed for mild constipation.   omeprazole 20 MG tablet Commonly known as:  PRILOSEC OTC Take 20 mg by mouth daily.   oxyCODONE 5 MG immediate release tablet Commonly known as:  Oxy IR/ROXICODONE Take 1 tablet (5 mg total) by mouth every 6 (six) hours as needed for moderate pain.   polyethylene glycol packet Commonly known as:  MIRALAX / GLYCOLAX Take 17 g by mouth daily as needed for mild constipation.        Follow-up Information    [facility] Follow up on 02/13/2019.   Specialty:  General Surgery Why:  Your follow up appointment is on 03/12 at 3 pm with the PA. Please arrive 30 minutes early for paperwork. Please bring a copy of your photo ID and insurance card.  Contact information: [address] [place] [address] [phone]           Signed: Leary Roca, [facility] 01/31/2019, 11:00 AM Pager: [phone]

## 2019-09-15 ENCOUNTER — Encounter (HOSPITAL_BASED_OUTPATIENT_CLINIC_OR_DEPARTMENT_OTHER): Payer: Self-pay

## 2019-09-15 ENCOUNTER — Emergency Department (HOSPITAL_BASED_OUTPATIENT_CLINIC_OR_DEPARTMENT_OTHER): Payer: Self-pay

## 2019-09-15 ENCOUNTER — Emergency Department (HOSPITAL_BASED_OUTPATIENT_CLINIC_OR_DEPARTMENT_OTHER)
Admission: EM | Admit: 2019-09-15 | Discharge: 2019-09-15 | Disposition: A | Discharge status: Home / Self Care | Payer: Self-pay | Attending: Emergency Medicine | Admitting: Emergency Medicine

## 2019-09-15 ENCOUNTER — Other Ambulatory Visit: Payer: Self-pay

## 2019-09-15 DIAGNOSIS — Z87891 Personal history of nicotine dependence: Secondary | ICD-10-CM | POA: Insufficient documentation

## 2019-09-15 DIAGNOSIS — R109 Unspecified abdominal pain: Secondary | ICD-10-CM

## 2019-09-15 DIAGNOSIS — R1084 Generalized abdominal pain: Secondary | ICD-10-CM | POA: Insufficient documentation

## 2019-09-15 DIAGNOSIS — R197 Diarrhea, unspecified: Secondary | ICD-10-CM | POA: Insufficient documentation

## 2019-09-15 DIAGNOSIS — Z6841 Body Mass Index (BMI) 40.0 and over, adult: Secondary | ICD-10-CM | POA: Insufficient documentation

## 2019-09-15 DIAGNOSIS — Z79899 Other long term (current) drug therapy: Secondary | ICD-10-CM | POA: Insufficient documentation

## 2019-09-15 DIAGNOSIS — E669 Obesity, unspecified: Secondary | ICD-10-CM | POA: Insufficient documentation

## 2019-09-15 DIAGNOSIS — R195 Other fecal abnormalities: Secondary | ICD-10-CM

## 2019-09-15 LAB — CBC WITH DIFFERENTIAL/PLATELET
Abs Immature Granulocytes: 0.03 10*3/uL (ref 0.00–0.07)
Basophils Absolute: 0.1 10*3/uL (ref 0.0–0.1)
Basophils Relative: 1 %
Eosinophils Absolute: 0.2 10*3/uL (ref 0.0–0.5)
Eosinophils Relative: 2 %
HCT: 44.9 % (ref 39.0–52.0)
Hemoglobin: 14.4 g/dL (ref 13.0–17.0)
Immature Granulocytes: 0 %
Lymphocytes Relative: 22 %
Lymphs Abs: 1.7 10*3/uL (ref 0.7–4.0)
MCH: 28.7 pg (ref 26.0–34.0)
MCHC: 32.1 g/dL (ref 30.0–36.0)
MCV: 89.4 fL (ref 80.0–100.0)
Monocytes Absolute: 0.9 10*3/uL (ref 0.1–1.0)
Monocytes Relative: 12 %
Neutro Abs: 4.9 10*3/uL (ref 1.7–7.7)
Neutrophils Relative %: 63 %
Platelets: 286 10*3/uL (ref 150–400)
RBC: 5.02 MIL/uL (ref 4.22–5.81)
RDW: 14 % (ref 11.5–15.5)
WBC: 7.8 10*3/uL (ref 4.0–10.5)
nRBC: 0 % (ref 0.0–0.2)

## 2019-09-15 LAB — COMPREHENSIVE METABOLIC PANEL
ALT: 17 U/L (ref 0–44)
AST: 16 U/L (ref 15–41)
Albumin: 3.5 g/dL (ref 3.5–5.0)
Alkaline Phosphatase: 84 U/L (ref 38–126)
Anion gap: 8 (ref 5–15)
BUN: 12 mg/dL (ref 6–20)
CO2: 27 mmol/L (ref 22–32)
Calcium: 8.6 mg/dL — ABNORMAL LOW (ref 8.9–10.3)
Chloride: 104 mmol/L (ref 98–111)
Creatinine, Ser: 1.19 mg/dL (ref 0.61–1.24)
GFR calc Af Amer: >60 mL/min (ref >60)
GFR calc non Af Amer: >60 mL/min (ref >60)
Glucose, Bld: 101 mg/dL — ABNORMAL HIGH (ref 70–99)
Potassium: 4 mmol/L (ref 3.5–5.1)
Sodium: 139 mmol/L (ref 135–145)
Total Bilirubin: 0.5 mg/dL (ref 0.3–1.2)
Total Protein: 6.9 g/dL (ref 6.5–8.1)

## 2019-09-15 LAB — LIPASE, BLOOD: Lipase: 25 U/L (ref 11–51)

## 2019-09-15 MED ORDER — IOHEXOL 300 MG/ML  SOLN
100.0000 mL | Freq: Once | INTRAMUSCULAR | Status: AC | PRN
  Administered 2019-09-15: 100 mL via INTRAVENOUS

## 2019-09-15 NOTE — ED Notes (Signed)
Pt given urinal for sample 

## 2019-09-15 NOTE — ED Provider Notes (Signed)
[facility] Provider Note   CSN: [id number] Arrival date & time: 09/15/19  1158     History   Chief Complaint Chief Complaint  Patient presents with  . Abdominal Pain    HPI Alan [facility] is a 53 y.o. male with PMHx GERD and PSHx cholecystectomy who presents to the ED today complaining of gradual onset, intermittent, diffuse abdominal pain since June 2020.  And also complains of looser stools, multiple episodes per day.  He states that he had his gallbladder taken out in February of this year.  He did not have any issues until June when he began having intermittent abdominal pain.  Patient states whenever he eats something he immediately has to run to the bathroom and has a loose bowel movement.  States it has started acting his job prompting him to come to the ED today.  His last bowel movement was just prior to arrival.  No bright red blood per rectum or melena.  Patient has not been taking anything for his symptoms.  Denies fever, chills, nausea, vomiting, hematemesis, coffee-ground emesis, urinary symptoms, back pain, any other associated symptoms.  He reports that he first attributed his symptoms to his mother passing away in June and thought it was his "nerves."  Patient does not have a PCP.  States he has never had a colonoscopy in the past.  His paternal grandfather had colon cancer in his 74s.  No unintentional weight loss.  Patient does state that he feels more bloated and can only take small bites of his food before he feels like he is full.        Past Medical History:  Diagnosis Date  . GERD (gastroesophageal reflux disease)     Patient Active Problem List   Diagnosis Date Noted  . Symptomatic cholelithiasis 01/29/2019  . Abscess of left groin 03/31/2016    Past Surgical History:  Procedure Laterality Date  . CHOLECYSTECTOMY N/A 01/30/2019   Procedure: LAPAROSCOPIC CHOLECYSTECTOMY;  Surgeon: Leighton Ruff, MD;  Location: [facility];   Service: General;  Laterality: N/A;  . INCISION AND DRAINAGE ABSCESS Left 03/31/2016   Procedure: INCISION AND DRAINAGE OF LEFT INNER THIGH ABSCESS;  Surgeon: Autumn Messing III, MD;  Location: [facility];  Service: General;  Laterality: Left;  . NECK SURGERY          Home Medications    Prior to Admission medications   Medication Sig Start Date End Date Taking? Authorizing Provider  acetaminophen (TYLENOL) 325 MG tablet Take 2 tablets (650 mg total) by mouth every 6 (six) hours as needed for mild pain (or temp > 100). 01/30/19   Maczis, Barth Kirks, PA-C  docusate sodium (COLACE) 100 MG capsule Take 1 capsule (100 mg total) by mouth 2 (two) times daily as needed for mild constipation. 01/30/19   Maczis, Barth Kirks, PA-C  omeprazole (PRILOSEC OTC) 20 MG tablet Take 20 mg by mouth daily.    [provider]  oxyCODONE (OXY IR/ROXICODONE) 5 MG immediate release tablet Take 1 tablet (5 mg total) by mouth every 6 (six) hours as needed for moderate pain. 01/30/19   Maczis, Barth Kirks, PA-C  polyethylene glycol (MIRALAX / GLYCOLAX) packet Take 17 g by mouth daily as needed for mild constipation. 01/30/19   Maczis, Barth Kirks, PA-C    Family History No family history on file.  Social History Social History   Tobacco Use  . Smoking status: Former Smoker    Types: Cigarettes  Quit date: 01/29/2013    Years since quitting: 6.6  . Smokeless tobacco: Never Used  Substance Use Topics  . Alcohol use: No  . Drug use: No     Allergies   Patient has no known allergies.   Review of Systems Review of Systems  Constitutional: Negative for chills and fever.  HENT: Negative for congestion.   Eyes: Negative for visual disturbance.  Respiratory: Negative for cough and shortness of breath.   Cardiovascular: Negative for chest pain.  Gastrointestinal: Positive for abdominal pain and diarrhea. Negative for blood in stool, constipation, nausea and vomiting.  Genitourinary: Negative for difficulty  urinating, dysuria, flank pain and frequency.  Musculoskeletal: Negative for myalgias.  Skin: Negative for rash.  Neurological: Negative for headaches.     Physical Exam Updated Vital Signs BP 134/83 (BP Location: Left Arm)   Pulse 68   Temp 98.7 F (37.1 C) (Oral)   Resp 20   Ht 5\' 6"  (1.676 m)   Wt 118.8 kg   SpO2 100%   BMI 42.29 kg/m   Physical Exam Vitals signs and nursing note reviewed.  Constitutional:      Appearance: He is obese. He is not ill-appearing or diaphoretic.  HENT:     Head: Normocephalic and atraumatic.  Eyes:     Conjunctiva/sclera: Conjunctivae normal.  Neck:     Musculoskeletal: Neck supple.  Cardiovascular:     Rate and Rhythm: Normal rate and regular rhythm.     Heart sounds: Normal heart sounds.  Pulmonary:     Effort: Pulmonary effort is normal.     Breath sounds: Normal breath sounds. No wheezing, rhonchi or rales.  Abdominal:     General: Abdomen is protuberant. Bowel sounds are normal.     Palpations: Abdomen is soft.     Tenderness: There is abdominal tenderness in the right upper quadrant, epigastric area and periumbilical area. There is no right CVA tenderness, left CVA tenderness, guarding or rebound.     Hernia: No hernia is present.  Skin:    General: Skin is warm and dry.  Neurological:     Mental Status: He is alert.      ED Treatments / Results  Labs (all labs ordered are listed, but only abnormal results are displayed) Labs Reviewed  COMPREHENSIVE METABOLIC PANEL - Abnormal; Notable for the following components:      Result Value   Glucose, Bld 101 (*)    Calcium 8.6 (*)    All other components within normal limits  LIPASE, BLOOD  CBC WITH DIFFERENTIAL/PLATELET    EKG None  Radiology Ct Abdomen Pelvis W Contrast  Result Date: 09/15/2019 CLINICAL DATA:  Acute abdominal pain, generalized, with abdominal bloating and diarrhea for a few months. History of cholecystectomy. EXAM: CT ABDOMEN AND PELVIS WITH  CONTRAST TECHNIQUE: Multidetector CT imaging of the abdomen and pelvis was performed using the standard protocol following bolus administration of intravenous contrast. CONTRAST:  11/15/2019 OMNIPAQUE IOHEXOL 300 MG/ML  SOLN COMPARISON:  01/29/2019 CT angiogram of the chest, abdomen and pelvis. FINDINGS: Lower chest: No significant pulmonary nodules or acute consolidative airspace disease. Hepatobiliary: Normal liver size. Tiny subcentimeter hypodense left liver dome lesion is too small to characterize and requires no follow-up unless the patient has risk factors for liver malignancy. No additional liver lesions. Cholecystectomy. No biliary ductal dilatation. Pancreas: Normal, with no mass or duct dilation. Spleen: Normal size. No mass. Adrenals/Urinary Tract: Normal adrenals. Normal kidneys with no hydronephrosis and no renal mass. Normal bladder.  Stomach/Bowel: Normal non-distended stomach. Normal caliber small bowel with no small bowel wall thickening. Normal appendix. Normal large bowel with no diverticulosis, large bowel wall thickening or pericolonic fat stranding. Vascular/Lymphatic: Atherosclerotic nonaneurysmal abdominal aorta. Patent portal, splenic, hepatic and renal veins. No pathologically enlarged lymph nodes in the abdomen or pelvis. Reproductive: Top-normal size prostate. Other: No pneumoperitoneum, ascites or focal fluid collection. Scattered small subcutaneous nodular foci of fat stranding in the ventral abdominal wall bilaterally, for example on series 2/image 34). No fluid collections. Musculoskeletal: No aggressive appearing focal osseous lesions. Moderate thoracolumbar spondylosis. IMPRESSION: 1. No acute abnormality. No evidence of bowel obstruction or acute bowel inflammation. Normal appendix. 2. Nonspecific scattered small nodular subcutaneous foci fat stranding in the ventral abdominal wall, which can be seen from subcutaneous injection of medications. No fluid collections. Electronically Signed    By: Delbert Phenix M.D.   On: 09/15/2019 14:37    Procedures Procedures (including critical care time)  Medications Ordered in ED Medications  iohexol (OMNIPAQUE) 300 MG/ML solution 100 mL (100 mLs Intravenous Contrast Given 09/15/19 1417)     Initial Impression / Assessment and Plan / ED Course  I have reviewed the triage vital signs and the nursing notes.  Pertinent labs & imaging results that were available during my care of the patient were reviewed by me and considered in my medical decision making (see chart for details).    53 year old male presents the ED complaining of intermittent abdominal discomfort and looser stools for the past 3 to 4 months.  Recently had his gallbladder removed in February without any issues until 3 to 4 months ago.  She does have some tenderness to the epigastrium periumbilical and right upper quadrant on exam today.  He denies any nausea or vomiting with his symptoms.  Last bowel movement was earlier this morning.  Vital signs are stable today and patient does not appear uncomfortable on exam today.  He is nontoxic-appearing.  The symptoms have been ongoing for such a long time I feel patient would benefit from outpatient GI follow-up.  We will give this at time of discharge.   Obtain baseline blood work at this time including CBC, CMP, lipase.  We will also CT abdomen and pelvis.   Lab work reassuring today.  No leukocytosis.  Hemoglobin stable.  No electrolyte abnormalities.  Creatinine 1.19.  This appears to be at baseline for patient.  No increase in LFTs.  Lipase negative.   CT scan without any obvious findings today.  Not feel there is any need for further intervention at this time.  I have discussed high-fiber diet with patient.  It appears he eats a lot of fried foods.  Do not feel that this is helping with his abdominal discomfort.  Given him follow-up with gastroenterology outpatient.  Patient advised to follow-up as soon as possible.  I have also  discussed importance of colonoscopy as he has not had 1 and he is over the age of 53.  Strict return precautions have been discussed with patient.  He is in agreement with plan at this time and stable for discharge home.   This note was prepared using Dragon voice recognition software and may include unintentional dictation errors due to the inherent limitations of voice recognition software.     Final Clinical Impressions(s) / ED Diagnoses   Final diagnoses:  Abdominal discomfort  Loose stools    ED Discharge Orders    None       Tanda Rockers, PA-C 09/15/19  [id number]    Gwyneth SproutPlunkett, Whitney, MD 09/15/19 2149

## 2019-09-15 NOTE — ED Notes (Signed)
Provider at bedside

## 2019-09-15 NOTE — Discharge Instructions (Signed)
Your labwork and CT scan were all very reassuring today Please follow up with gastroenterology for further evaluation - you may be experiencing symptoms related to your recent gallbladder removal  Attached is a resource of high fiber diet  I would recommend starting probiotics as well

## 2019-09-15 NOTE — ED Triage Notes (Addendum)
Pt c/o abd pain/bloating, diarrhea x "couple months"-NAD-steady gait

## 2019-10-25 IMAGING — CR DG CHEST 2V
2 series · 2 of 2 positions shown · non-contrast
Comparison: None.

CLINICAL DATA: Chest pain.  Epigastric pain and vomiting.

EXAM:
CHEST - 2 VIEW

[w chest pa]
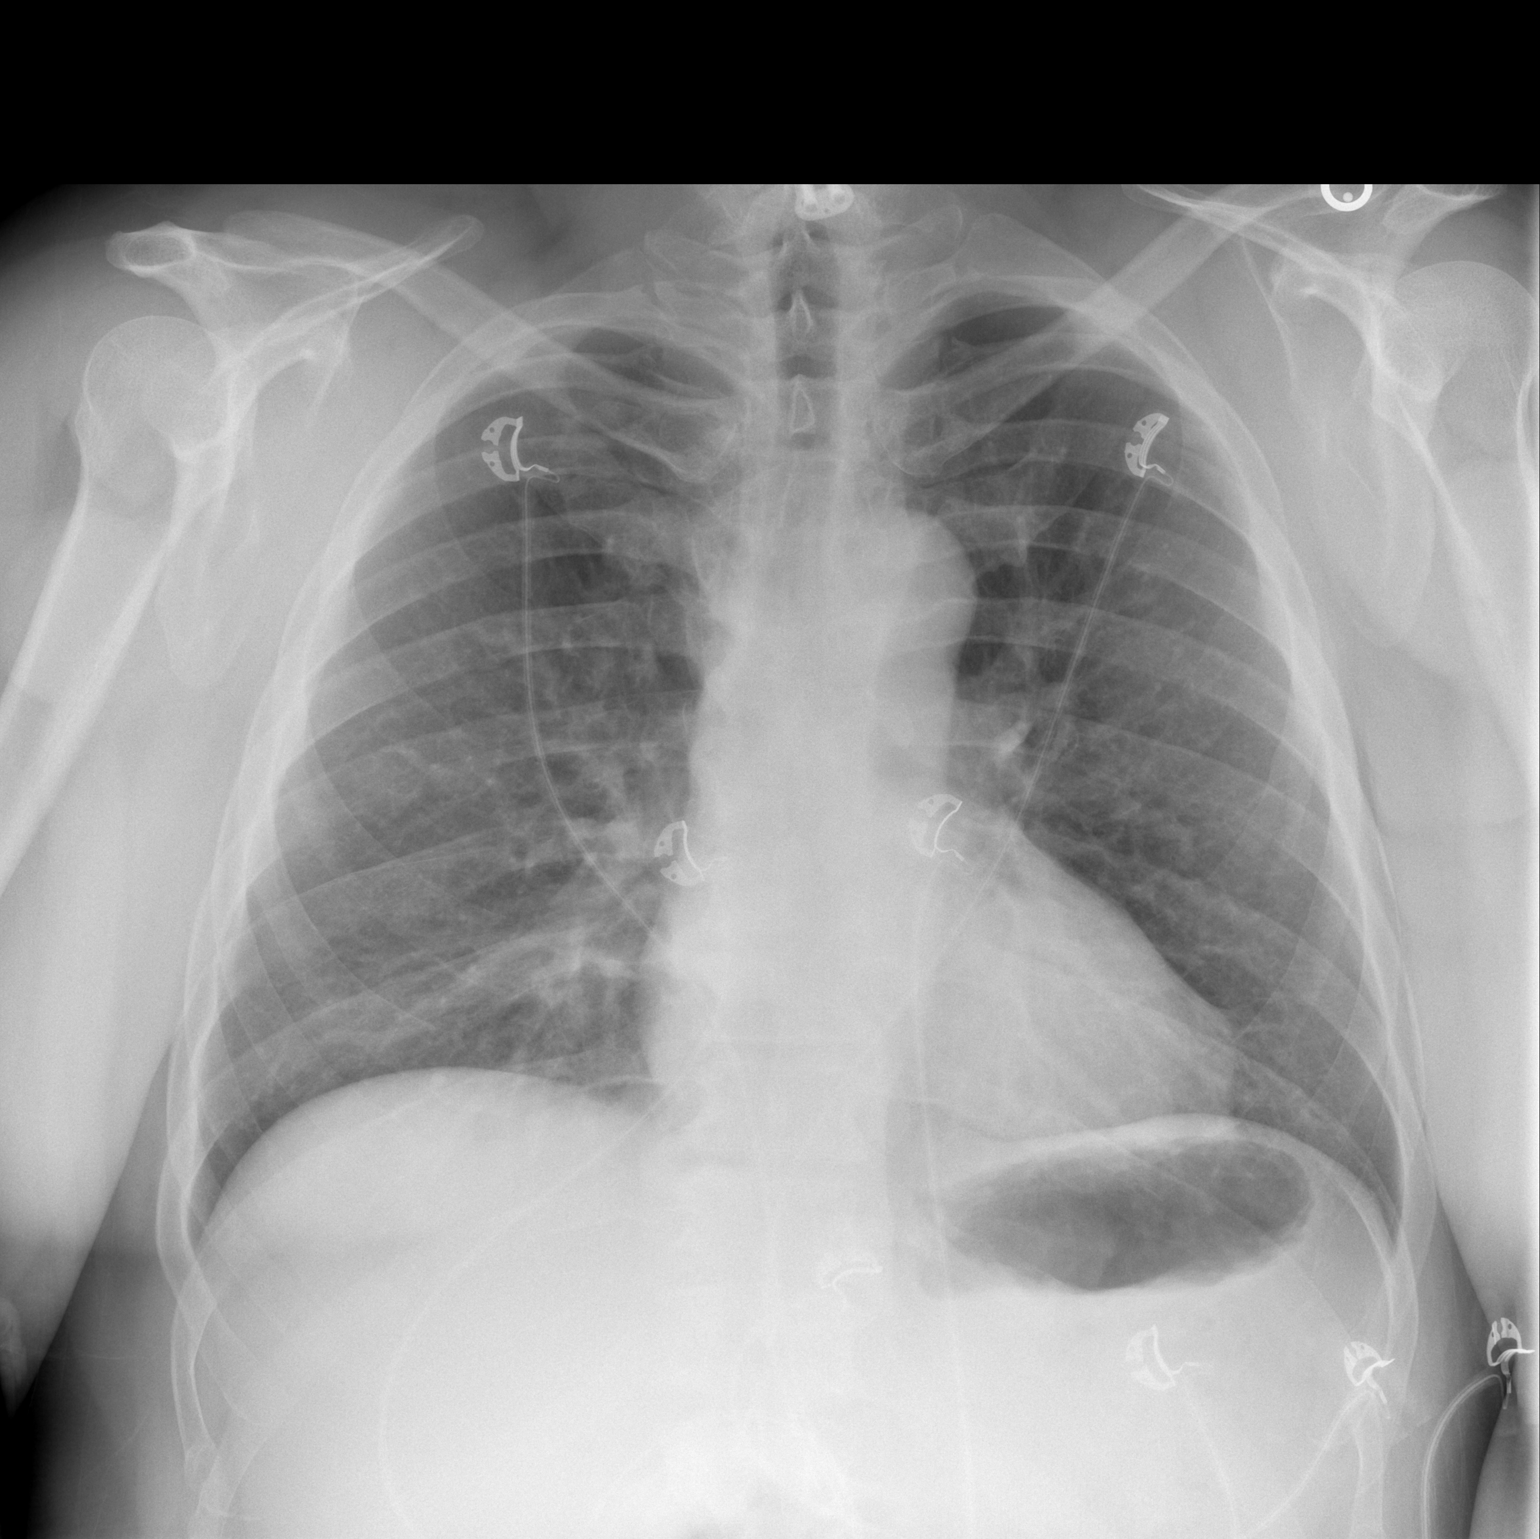

[w chest lat]
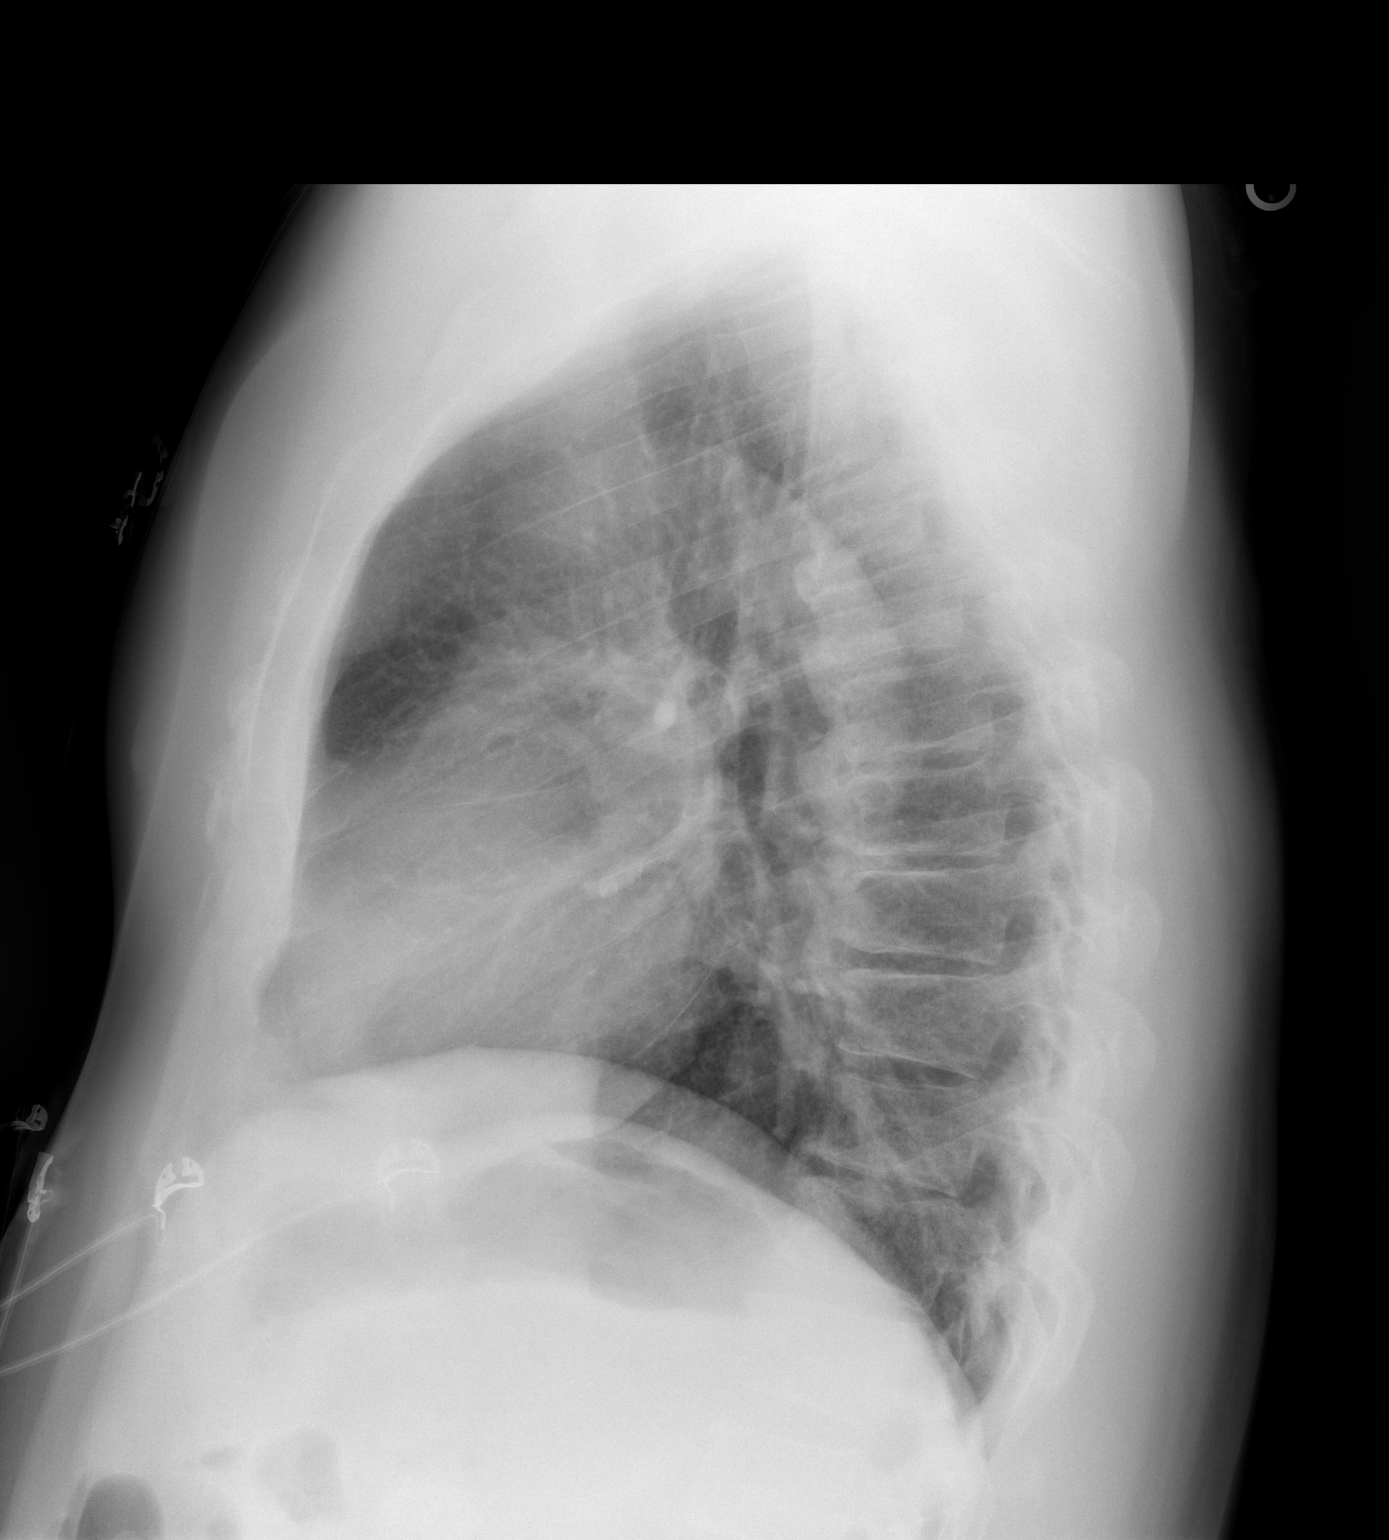

[2 of 2 positions shown; findings below may reference images not displayed]

FINDINGS: The cardiomediastinal silhouette is within normal limits. The lungs
are well inflated and clear. There is no evidence of pleural
effusion or pneumothorax. No acute osseous abnormality is
identified. Prior cervical spine fusion is noted.
IMPRESSION: No active cardiopulmonary disease.

## 2019-10-25 IMAGING — CT CT ANGIO CHEST-ABD-PELV FOR DISSECTION W/ AND WO/W CM
2 of 9 series · 14 of 46 positions shown, 16 images · IV contrast (iopamidol)
Comparison: Same day chest radiograph

CLINICAL DATA: Chest and back pain, rule out aortic dissection

EXAM:
CT ANGIOGRAPHY CHEST, ABDOMEN AND PELVIS
TECHNIQUE: Multidetector CT imaging through the chest, abdomen and pelvis was
performed both prior to and following the intravenous administration
of contrast using the standard protocol during bolus administration
of intravenous contrast. Multiplanar reconstructed images and MIPs
were obtained and reviewed to evaluate the vascular anatomy.
CONTRAST:  100mL 70VCI1-GN2 IOPAMIDOL (70VCI1-GN2) INJECTION 76%

[Series 6: axial arterial · axial · arterial · 0.98mm/px · z∈[-769,-88]mm · 11 of 253 slices shown, 13 images]
[im 13/253  soft-tissue]
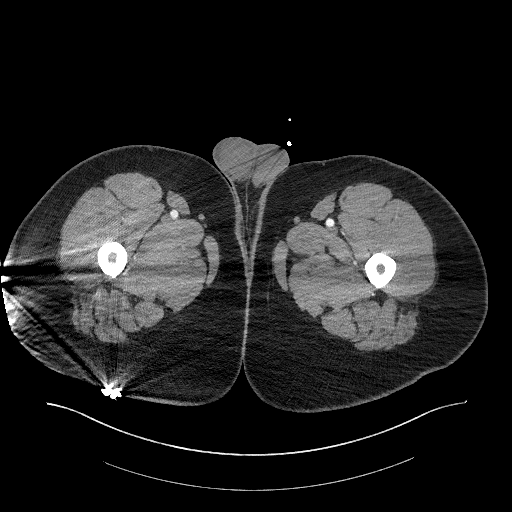
[im 13/253  bone]
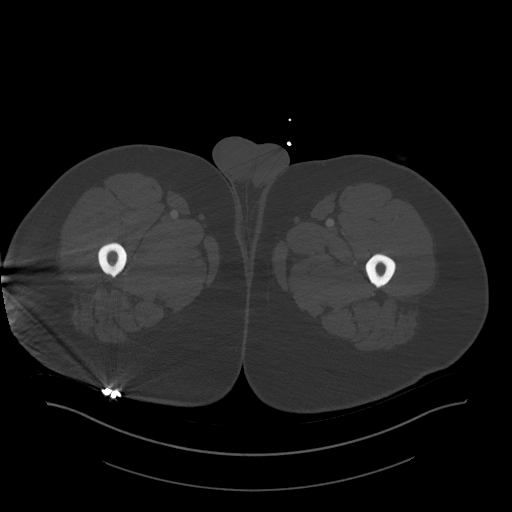
[im 38/253  soft-tissue]
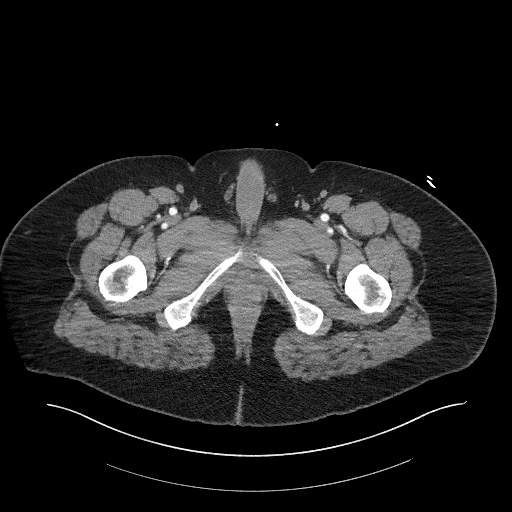
[im 64/253  soft-tissue]
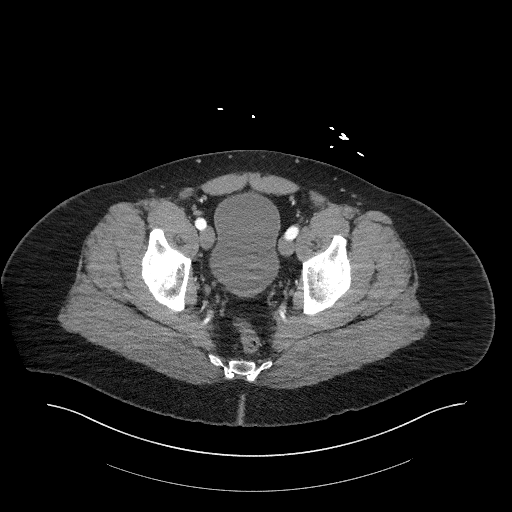
[im 89/253  soft-tissue]
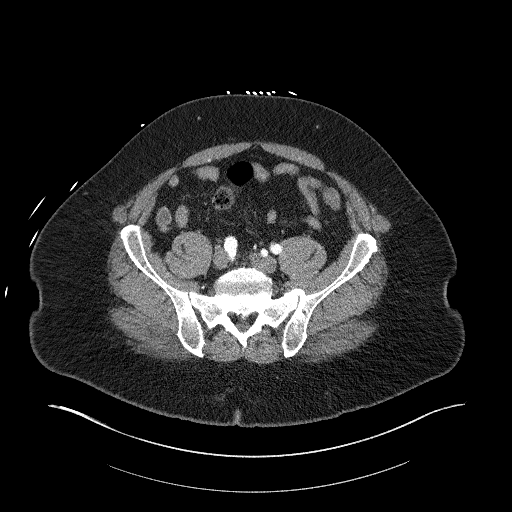
[im 101/253  soft-tissue]
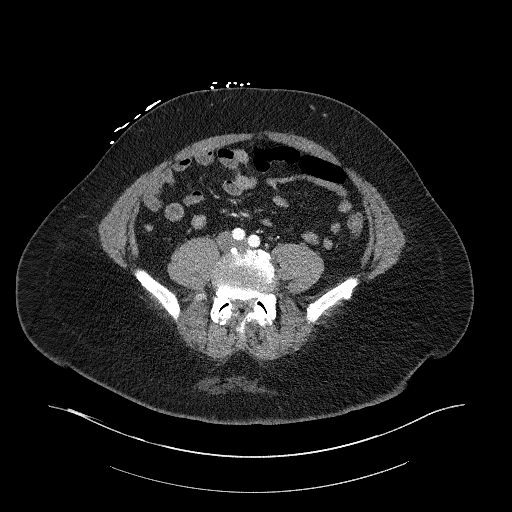
[im 127/253  soft-tissue]
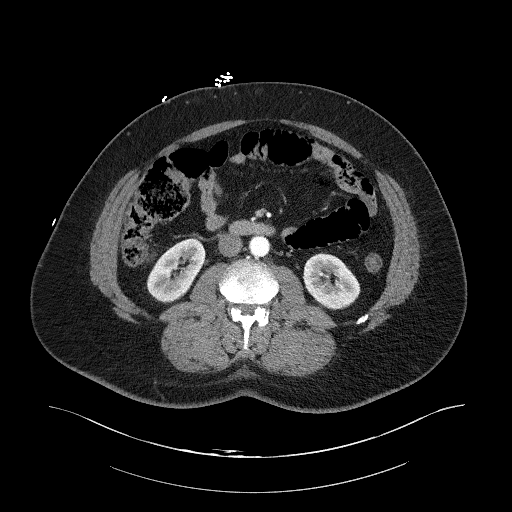
[im 152/253  soft-tissue]
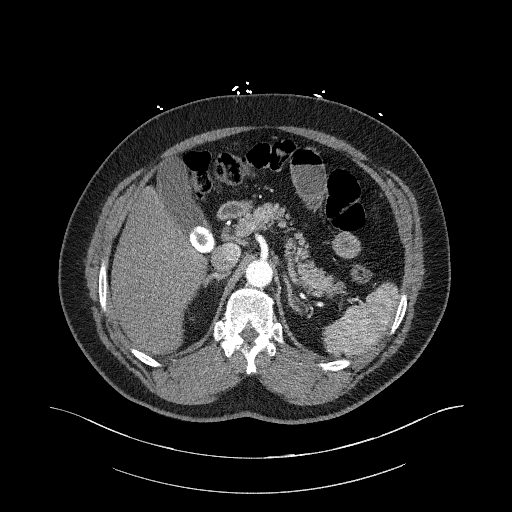
[im 164/253  soft-tissue]
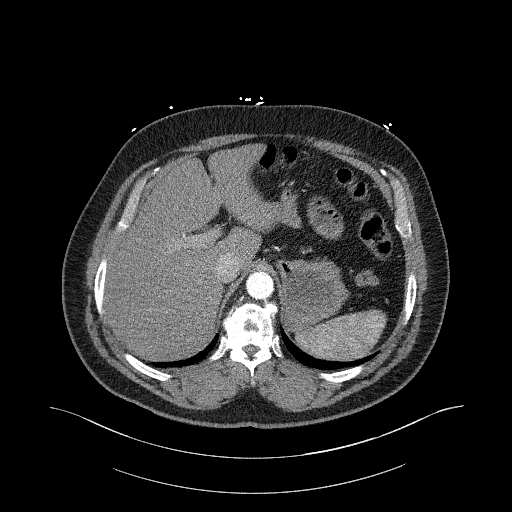
[im 190/253  soft-tissue]
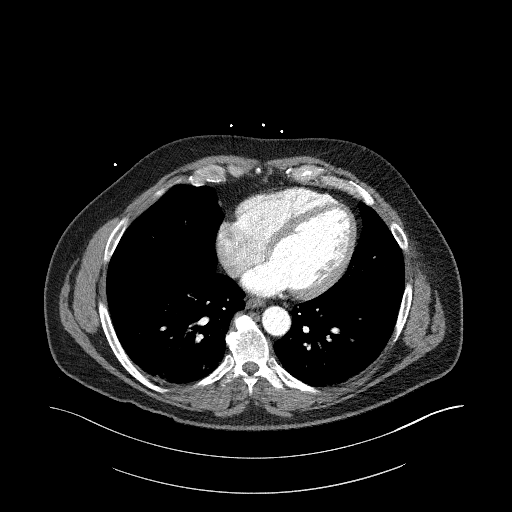
[im 190/253  bone]
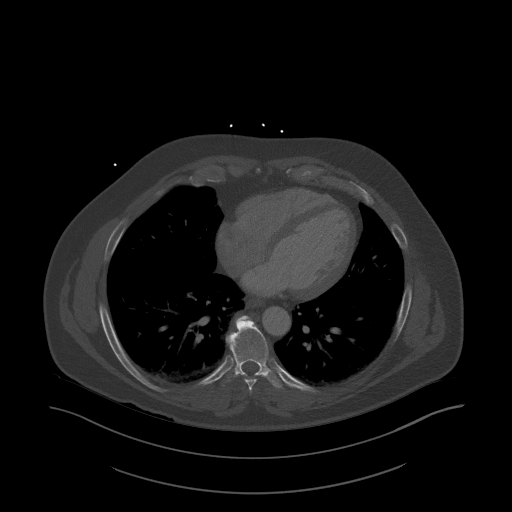
[im 215/253  soft-tissue]
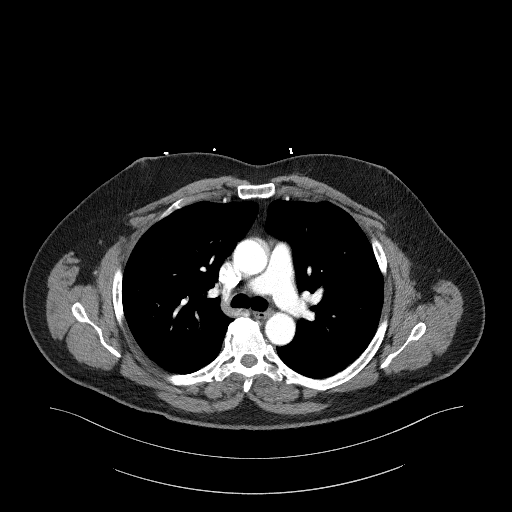
[im 240/253  soft-tissue]
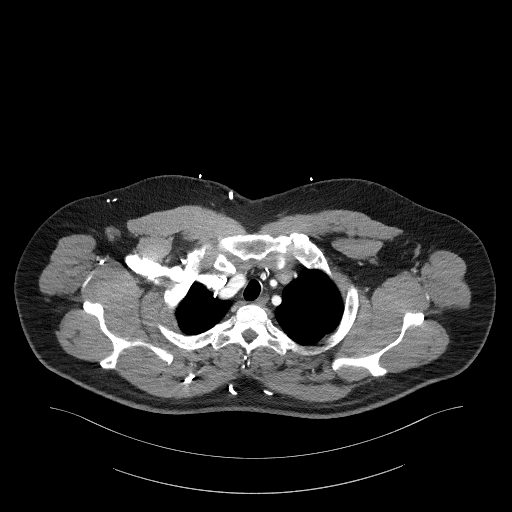

[Series 8: coronals · coronal · 1.03mm/px · 3 of 175 slices shown]
[im 44/175  soft-tissue]
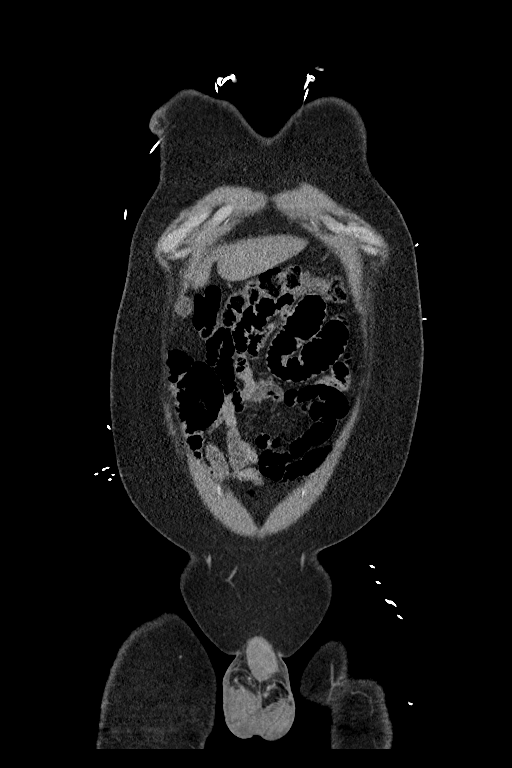
[im 88/175  soft-tissue]
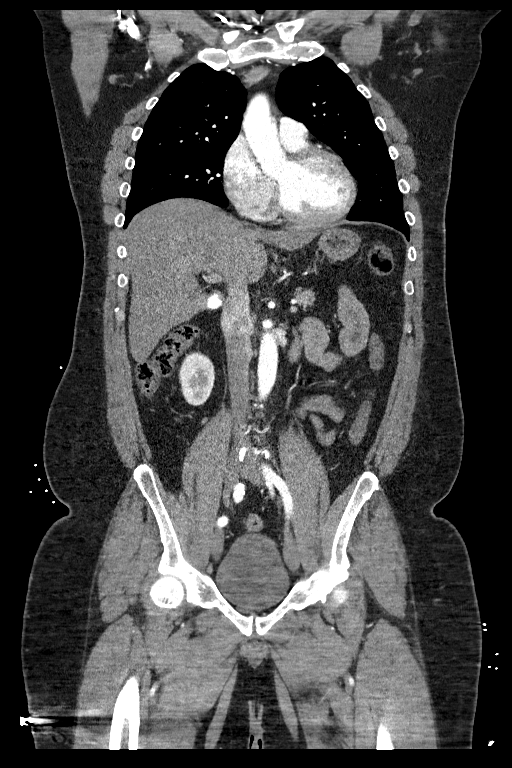
[im 131/175  soft-tissue]
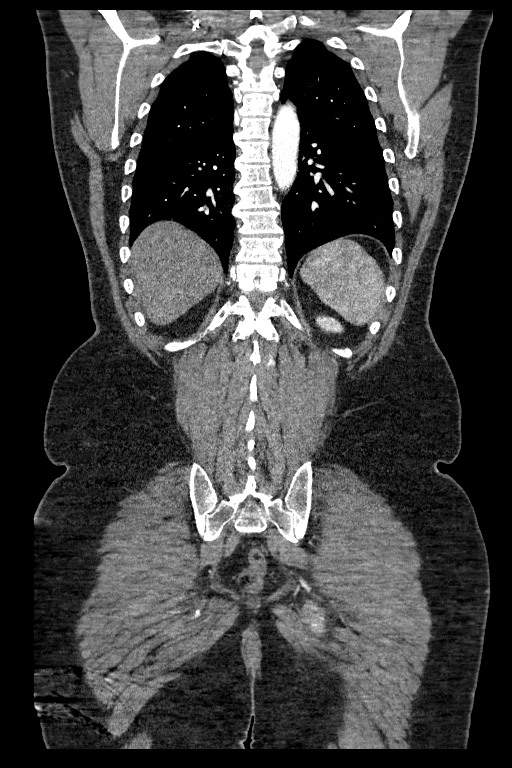

[14 of 46 positions shown; findings below may reference images not displayed]

FINDINGS: CTA CHEST FINDINGS

Cardiovascular: Satisfactory opacification of the pulmonary arteries
to the segmental level. No evidence of pulmonary embolism. Normal
heart size. Minimal left coronary artery calcifications. No
pericardial effusion.

Mediastinum/Nodes: No enlarged mediastinal, hilar, or axillary lymph
nodes. Thyroid gland, trachea, and esophagus demonstrate no
significant findings.

Lungs/Pleura: Lungs are clear. No pleural effusion or pneumothorax.

Upper Abdomen: No acute abnormality.

Musculoskeletal: No chest wall abnormality. No acute or significant
osseous findings. Disc degenerative disease of the thoracic spine.

Review of the MIP images confirms the above findings.

CTA ABDOMEN AND PELVIS FINDINGS

VASCULAR

Aorta: Normal caliber aorta without aneurysm, dissection, vasculitis
or significant stenosis. Minimal atherosclerosis of the inferior
aorta.

Celiac: Patent without evidence of aneurysm, dissection, vasculitis
or significant stenosis.

SMA: Patent without evidence of aneurysm, dissection, vasculitis or
significant stenosis.

Renals: Both renal arteries are patent without evidence of aneurysm,
dissection, vasculitis, fibromuscular dysplasia or significant
stenosis.

IMA: Patent without evidence of aneurysm, dissection, vasculitis or
significant stenosis.

Inflow: Patent without evidence of aneurysm, dissection, vasculitis
or significant stenosis.

Veins: No obvious venous abnormality within the limitations of this
arterial phase study.

Review of the MIP images confirms the above findings.

NON-VASCULAR

Lower chest: No acute abnormality.

Hepatobiliary: No focal liver abnormality is seen. Large gallstone
in the gallbladder near the gallbladder neck. No gallbladder wall
thickening, or biliary dilatation.

Pancreas: Unremarkable. No pancreatic ductal dilatation or
surrounding inflammatory changes.

Spleen: Normal in size without focal abnormality.

Adrenals/Urinary Tract: Adrenal glands are unremarkable. Kidneys are
normal, without renal calculi, focal lesion, or hydronephrosis.
Bladder is unremarkable.

Stomach/Bowel: Stomach is within normal limits. Appendix appears
normal. No evidence of bowel wall thickening, distention, or
inflammatory changes.

Vascular/Lymphatic: No significant vascular findings are present. No
enlarged abdominal or pelvic lymph nodes.

Reproductive: No mass or other abnormality.

Other: No abdominal wall hernia or abnormality. No abdominopelvic
ascites.

Musculoskeletal: No acute or significant osseous findings.

Review of the MIP images confirms the above findings.
IMPRESSION: 1. No evidence of aortic dissection, aneurysm, or other acute aortic
syndrome. Minimal atherosclerosis of the abdominal aorta.

2. Large gallstone in the gallbladder near the gallbladder neck. No
gallbladder wall thickening or biliary dilatation. No
pericholecystic fluid.

## 2020-06-10 IMAGING — CT CT ABD-PELV W/ CM
2 of 5 series · 16 of 46 positions shown, 18 images · IV contrast (APPLIED)
Comparison: 01/29/2019 CT angiogram of the chest, abdomen and
pelvis.

CLINICAL DATA: Acute abdominal pain, generalized, with abdominal
bloating and diarrhea for a few months. History of cholecystectomy.

EXAM:
CT ABDOMEN AND PELVIS WITH CONTRAST
TECHNIQUE: Multidetector CT imaging of the abdomen and pelvis was performed
using the standard protocol following bolus administration of
intravenous contrast.
CONTRAST:  100mL OMNIPAQUE IOHEXOL 300 MG/ML  SOLN

[Series 2: axial st · axial · 0.83mm/px · z∈[-636,-116]mm · 13 of 116 slices shown, 15 images]
[im 6/116  soft-tissue]
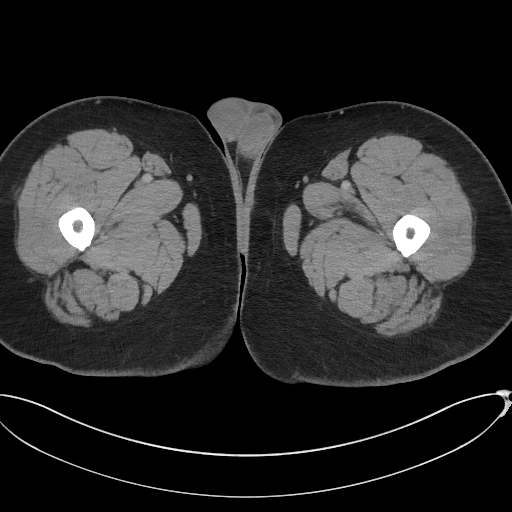
[im 6/116  bone]
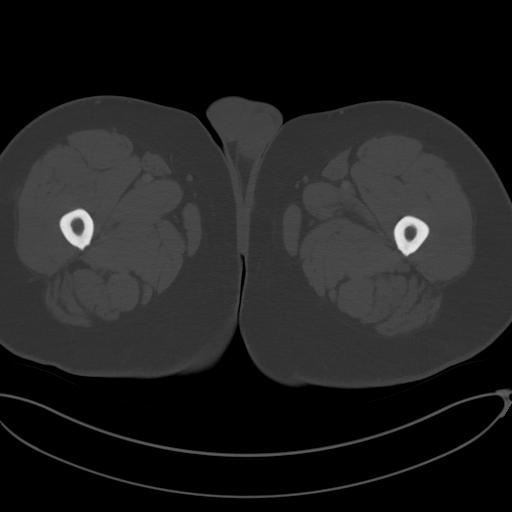
[im 18/116  soft-tissue]
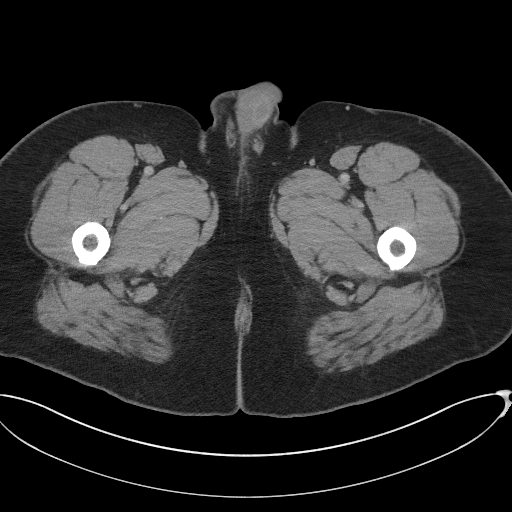
[im 24/116  soft-tissue]
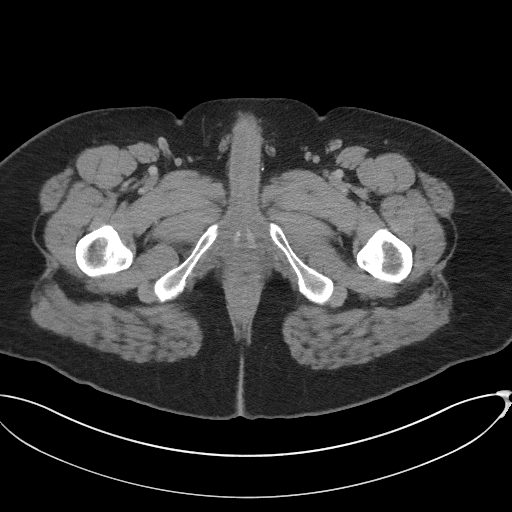
[im 35/116  soft-tissue]
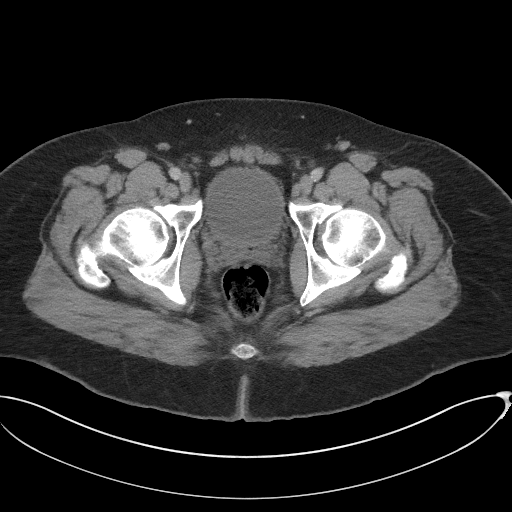
[im 41/116  soft-tissue]
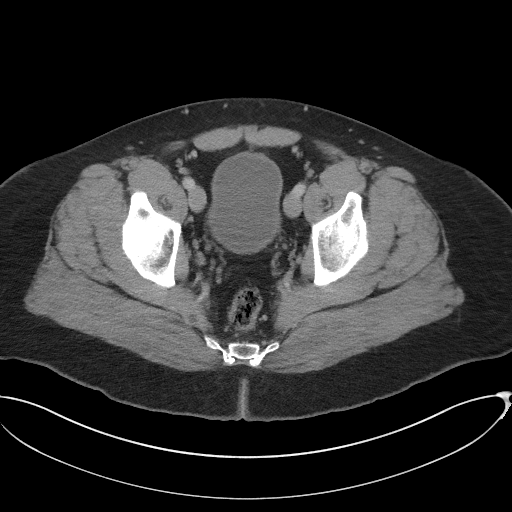
[im 52/116  soft-tissue]
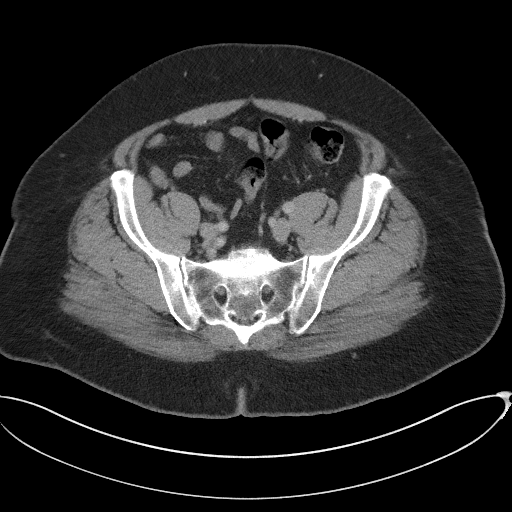
[im 58/116  soft-tissue]
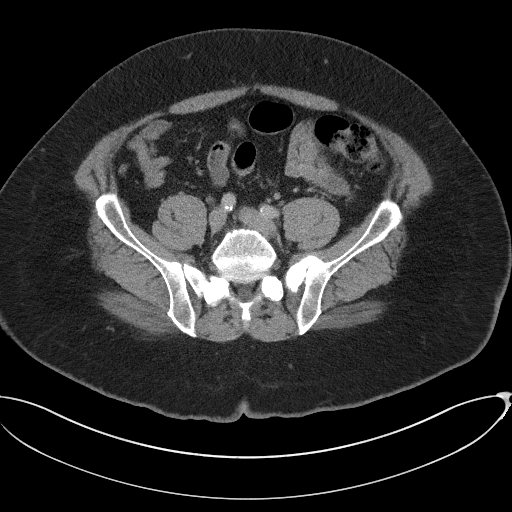
[im 64/116  soft-tissue]
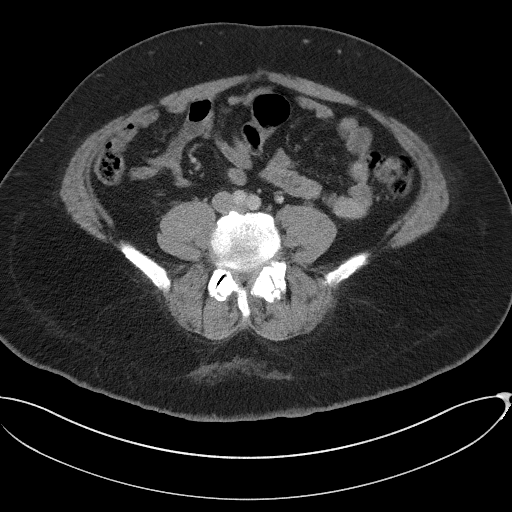
[im 75/116  soft-tissue]
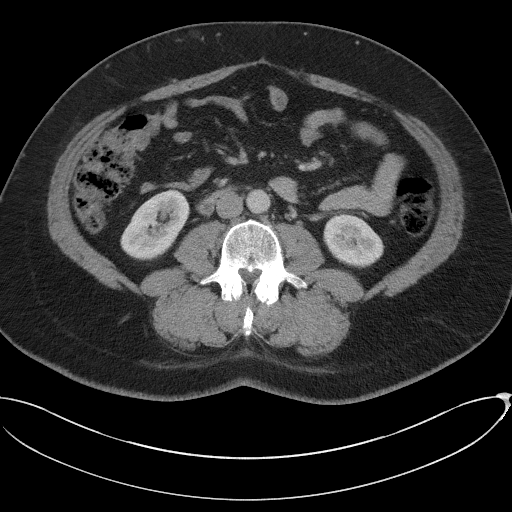
[im 75/116  bone]
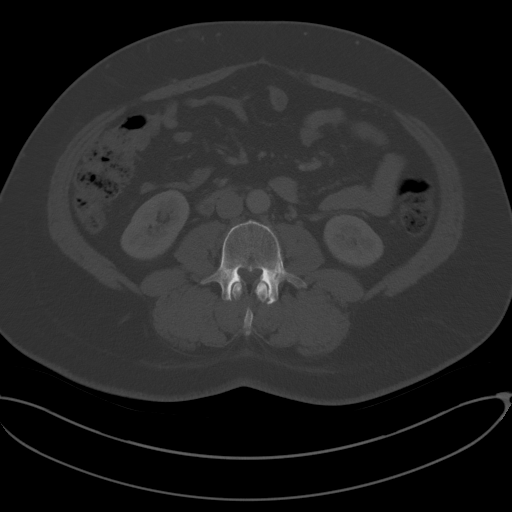
[im 81/116  soft-tissue]
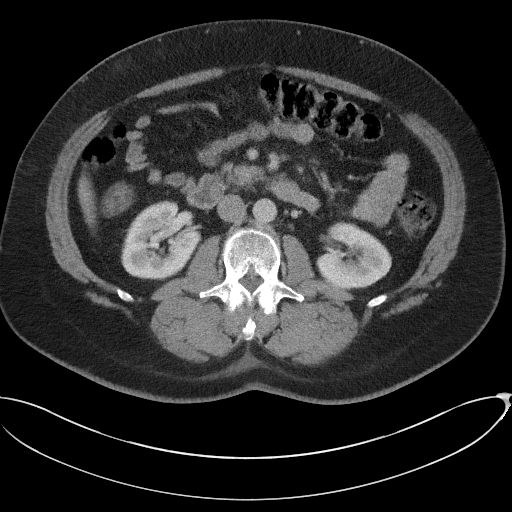
[im 93/116  soft-tissue]
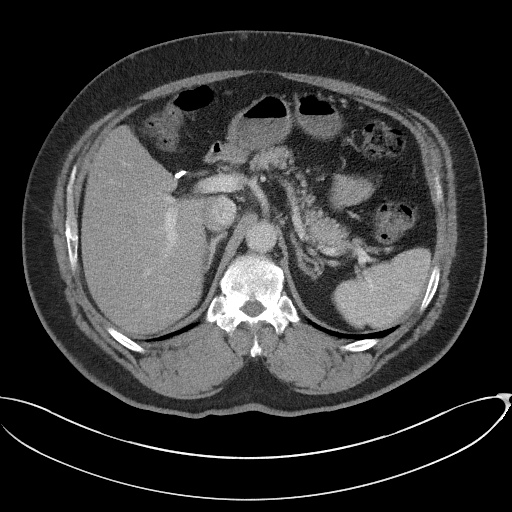
[im 98/116  soft-tissue]
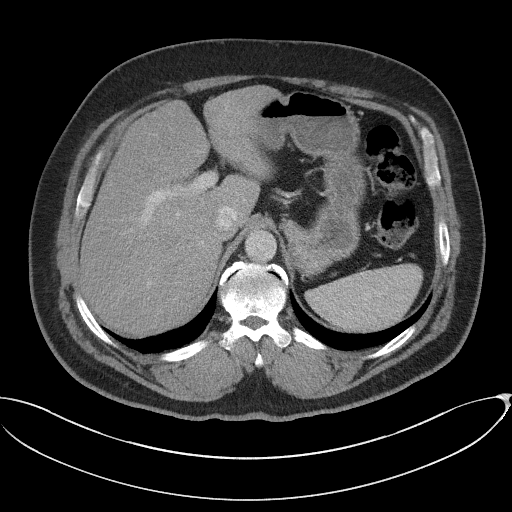
[im 110/116  soft-tissue]
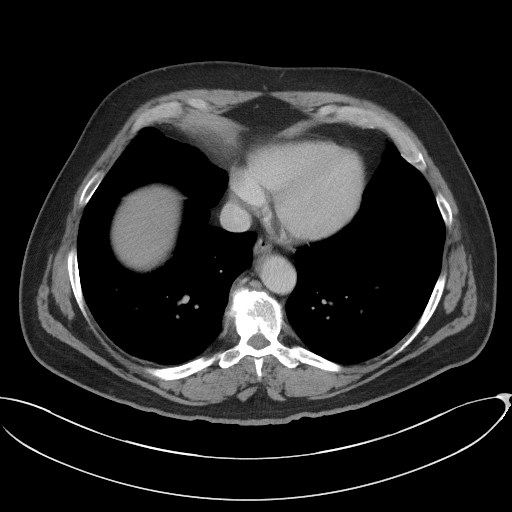

[Series 4: coronal st · coronal · 0.94mm/px · 3 of 123 slices shown]
[im 41/123  soft-tissue]
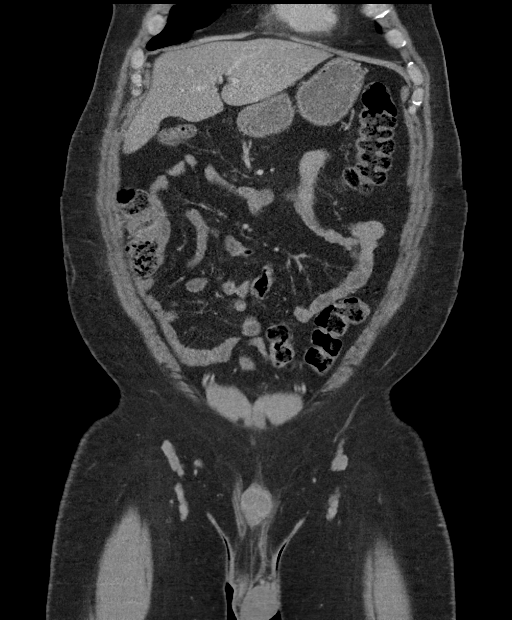
[im 55/123  soft-tissue]
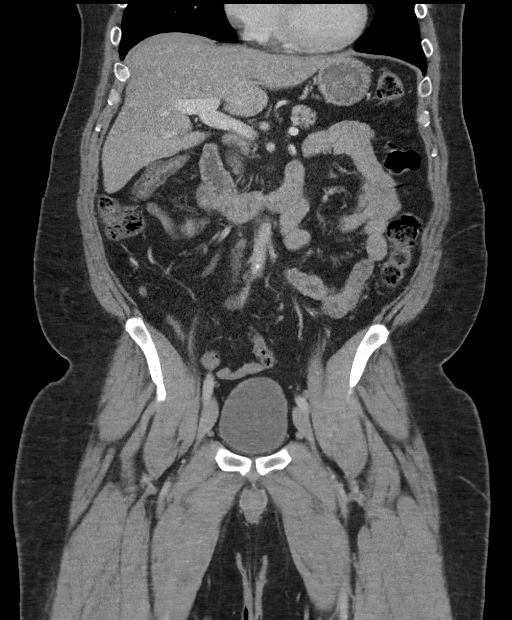
[im 68/123  soft-tissue]
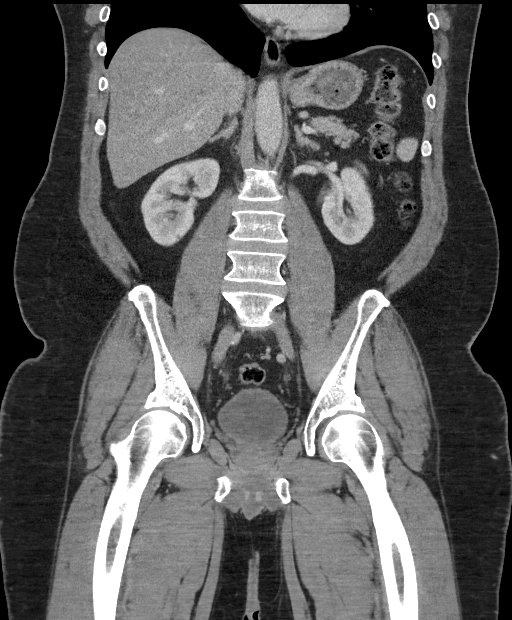

[16 of 46 positions shown; findings below may reference images not displayed]

FINDINGS: Lower chest: No significant pulmonary nodules or acute consolidative
airspace disease.

Hepatobiliary: Normal liver size. Tiny subcentimeter hypodense left
liver dome lesion is too small to characterize and requires no
follow-up unless the patient has risk factors for liver malignancy.
No additional liver lesions. Cholecystectomy. No biliary ductal
dilatation.

Pancreas: Normal, with no mass or duct dilation.

Spleen: Normal size. No mass.

Adrenals/Urinary Tract: Normal adrenals. Normal kidneys with no
hydronephrosis and no renal mass. Normal bladder.

Stomach/Bowel: Normal non-distended stomach. Normal caliber small
bowel with no small bowel wall thickening. Normal appendix. Normal
large bowel with no diverticulosis, large bowel wall thickening or
pericolonic fat stranding.

Vascular/Lymphatic: Atherosclerotic nonaneurysmal abdominal aorta.
Patent portal, splenic, hepatic and renal veins. No pathologically
enlarged lymph nodes in the abdomen or pelvis.

Reproductive: Top-normal size prostate.

Other: No pneumoperitoneum, ascites or focal fluid collection.
Scattered small subcutaneous nodular foci of fat stranding in the
ventral abdominal wall bilaterally, for example on series 2/image
34). No fluid collections.

Musculoskeletal: No aggressive appearing focal osseous lesions.
Moderate thoracolumbar spondylosis.
IMPRESSION: 1. No acute abnormality. No evidence of bowel obstruction or acute
bowel inflammation. Normal appendix.
2. Nonspecific scattered small nodular subcutaneous foci fat
stranding in the ventral abdominal wall, which can be seen from
subcutaneous injection of medications. No fluid collections.

## 2022-09-28 NOTE — Progress Notes (Addendum)
[facility] - Clinic Note  10/02/2022     CHIEF COMPLAINT Patient presents for Retina Evaluation   HISTORY OF PRESENT ILLNESS: Alan Goodman is a 56 y.o. male who presents to the clinic today for:   HPI     Retina Evaluation   In both eyes.  This started 3 days ago.  Duration of 3 days.  Associated Symptoms Negative for Fever.  I, the attending physician,  performed the HPI with the patient and updated documentation appropriately.        Comments   Retina eval  pt has retina bleeding ou pt is reporting pt denies any vision changes noticed or flashes or floaters       Last edited by Rennis Chris, MD on 10/02/2022 12:21 PM.    Pt is here on the referral of Dr. Bufford Spikes at [facility], pt states Dr. Bufford Spikes saw bleeding in the back of his eyes, pt denies any medical problems, states he does not take any medications, pt states he was not able to get his glasses Rx until he came to this appt  Referring physician: [facility] [address] [place],  [place] [address]  HISTORICAL INFORMATION:   Selected notes from the MEDICAL RECORD NUMBER Referred by Dr. Bufford Spikes at [facility], [facility] for retinal hemorrhages OS LEE:  Ocular Hx- PMH-    CURRENT MEDICATIONS: No current outpatient medications on file. (Ophthalmic Drugs)   No current facility-administered medications for this visit. (Ophthalmic Drugs)   Current Outpatient Medications (Other)  Medication Sig   acetaminophen (TYLENOL) 325 MG tablet Take 2 tablets (650 mg total) by mouth every 6 (six) hours as needed for mild pain (or temp > 100).   docusate sodium (COLACE) 100 MG capsule Take 1 capsule (100 mg total) by mouth 2 (two) times daily as needed for mild constipation.   omeprazole (PRILOSEC OTC) 20 MG tablet Take 20 mg by mouth daily.   oxyCODONE (OXY IR/ROXICODONE) 5 MG immediate release tablet Take 1 tablet (5 mg total) by mouth every 6 (six) hours as needed for moderate pain.    polyethylene glycol (MIRALAX / GLYCOLAX) packet Take 17 g by mouth daily as needed for mild constipation.   No current facility-administered medications for this visit. (Other)   REVIEW OF SYSTEMS: ROS   Negative for: Constitutional, Gastrointestinal, Neurological, Skin, Genitourinary, Musculoskeletal, HENT, Endocrine, Cardiovascular, Eyes, Respiratory, Psychiatric, Allergic/Imm, Heme/Lymph Last edited by Rennis Chris, MD on 10/02/2022 12:35 PM.     ALLERGIES No Known Allergies  PAST MEDICAL HISTORY Past Medical History:  Diagnosis Date   GERD (gastroesophageal reflux disease)    Past Surgical History:  Procedure Laterality Date   CHOLECYSTECTOMY N/A 01/30/2019   Procedure: LAPAROSCOPIC CHOLECYSTECTOMY;  Surgeon: Romie Levee, MD;  Location: [facility];  Service: General;  Laterality: N/A;   INCISION AND DRAINAGE ABSCESS Left 03/31/2016   Procedure: INCISION AND DRAINAGE OF LEFT INNER THIGH ABSCESS;  Surgeon: Chevis Pretty III, MD;  Location: [facility];  Service: General;  Laterality: Left;   NECK SURGERY     FAMILY HISTORY History reviewed. No pertinent family history.  SOCIAL HISTORY Social History   Tobacco Use   Smoking status: Former    Types: Cigarettes    Quit date: 01/29/2013    Years since quitting: 9.6   Smokeless tobacco: Never  Vaping Use   Vaping Use: Every day  Substance Use Topics   Alcohol use: No   Drug use: No  OPHTHALMIC EXAM:  Base Eye Exam     Visual Acuity (Snellen - Linear)       Right Left   Dist [place] 20/40 20/40   Dist ph [place] 20/30 NI         Tonometry (Tonopen)   Unable would not open squeezing         Pupils       Pupils Dark Light Shape React APD   Right PERRL 3 2 Round Brisk None   Left PERRL 3 2 Round Brisk None         Visual Fields       Left Right    Full Full         Extraocular Movement       Right Left    Full, Ortho Full, Ortho         Neuro/Psych     Oriented x3: Yes   Mood/Affect: Normal          Dilation     2.5% Phenylephrine @ 9:23 AM           Slit Lamp and Fundus Exam     Slit Lamp Exam       Right Left   Lids/Lashes Dermatochalasis - upper lid, mild MGD Dermatochalasis - upper lid, mild MGD   Conjunctiva/Sclera Mild temporal pinguecula temporal pinguecula   Cornea 1+ Punctate epithelial erosions arcus, trace PEE   Anterior Chamber deep and clear deep and clear   Iris Round and dilated Round and dilated   Lens 2+ Nuclear sclerosis, 2+ Cortical cataract 2+ Nuclear sclerosis, 2+ Cortical cataract   Anterior Vitreous mild syneresis mild syneresis         Fundus Exam       Right Left   Disc Pink and Sharp Pink and Sharp, mild hyperemia, focal disc heme at 0730   C/D Ratio 0.2 0.4   Macula Flat, Good foveal reflex, mild RPE mottling, No heme or edema Flat, blunted foveal reflex, rare fine MA, No edema   Vessels Normal Attenuated, 3+ dilated and tortuous venules, 360 DBH   Periphery Attached, focal pigmented CR atrophy at 0430, No heme Attached           Refraction     Manifest Refraction       Sphere Cylinder Axis Dist VA   Right -1.00 +0.50 180 20/30   Left -0.50 Sphere  20/40            IMAGING AND PROCEDURES  Imaging and Procedures for 10/02/2022  OCT, Retina - OU - Both Eyes       Right Eye Quality was good. Central Foveal Thickness: 304. Progression has no prior data. Findings include normal foveal contour, no IRF, no SRF.   Left Eye Quality was good. Central Foveal Thickness: 306. Progression has no prior data. Findings include normal foveal contour, no IRF, no SRF, intraretinal hyper-reflective material (Patchy IRHM superior macula, notably tortuous and dilated venules on en face image).   Notes *Images captured and stored on drive  Diagnosis / Impression:  OD: NFP, no IRF/SRF OS: Patchy IRHM superior macula, notably tortuous and dilated venules on en face image  Clinical management:  See below  Abbreviations: NFP -  Normal foveal profile. CME - cystoid macular edema. PED - pigment epithelial detachment. IRF - intraretinal fluid. SRF - subretinal fluid. EZ - ellipsoid zone. ERM - epiretinal membrane. ORA - outer retinal atrophy. ORT - outer retinal tubulation. SRHM - subretinal hyper-reflective material. IRHM -  intraretinal hyper-reflective material      Fluorescein Angiography Optos (Transit OS)       Right Eye Progression has no prior data. Early phase findings include normal observations. Mid/Late phase findings include normal observations, staining (Focal staining inf nasal peripheral midzone; no leakage).   Left Eye Progression has no prior data. Early phase findings include delayed filling (Delayed venous return). Mid/Late phase findings include leakage, vascular perfusion defect (Vascular nonperfusion -- temporal periphery; mild perivascular leakage from venules).   Notes **Images stored on drive**  Impression: OD: normal study; Focal staining inf nasal peripheral midzone; no leakage OS: Vascular nonperfusion -- temporal periphery; mild perivascular leakage from venules -- mild, remote RVO            ASSESSMENT/PLAN:    ICD-10-CM   1. Retinal hemorrhage of left eye  H35.62 OCT, Retina - OU - Both Eyes    2. Essential hypertension  I10     3. Hypertensive retinopathy of both eyes  H35.033 Fluorescein Angiography Optos (Transit OS)    4. Combined forms of age-related cataract of both eyes  H25.813      Retinal hemorrhages OS -- mild, remote RVO OS - referred by [facility] for retinal hemorrhages OS - originally presented to [facility] for updated glasses prescription -- otherwise asymptomatic - BCVA 20/40 OS - exam shows 360 peripheral DBH, some fine IRH in macula OS + dilated and tortuous venules OS - OCT shows patchy IRHM superior macula, notably tortuous and dilated venules on en face images - FA 10.30.23 shows Vascular nonperfusion temporal periphery; mild perivascular  leakage from venules -- mild, remote RVO - discussed findings, prognosis, and potential treatments - suspect mild remote RVO -- no CME at this time - no retinal or ophthalmic interventions indicated or recommended  - recommend monitoring for now - F/U 6-8 weeks -- DFE/OCT/possible injection - clear from a retina standpoint to proceed with glasses prescription - [facility] fax [phone]  2,3. Hypertensive retinopathy OU  - BP in office 140-150s / 80-90s - discussed importance of tight BP control - recommend establishment of PCP and further evaluation and management of BP  4. Mixed Cataract OU - The symptoms of cataract, surgical options, and treatments and risks were discussed with patient. - discussed diagnosis and progression - not yet visually significant - monitor for now   Ophthalmic Meds Ordered this visit:  No orders of the defined types were placed in this encounter.    Return for f/u 6-8 weeks--retinal hemes / RVO OS, DFE, OCT, review FA.  There are no Patient Instructions on file for this visit.   Explained the diagnoses, plan, and follow up with the patient and they expressed understanding.  Patient expressed understanding of the importance of proper follow up care.   This document serves as a record of services personally performed by Karie Chimera, MD, PhD. It was created on their behalf by Glee Arvin. Manson Passey, OA an ophthalmic technician. The creation of this record is the provider's dictation and/or activities during the visit.    Electronically signed by: Glee Arvin. Manson Passey, [place] 10.26.2023 12:44 PM  Karie Chimera, M.D., Ph.D. Diseases & Surgery of the Retina and Vitreous [facility]  I have reviewed the above documentation for accuracy and completeness, and I agree with the above. Karie Chimera, M.D., Ph.D. 10/02/22 12:44 PM  Abbreviations: M myopia (nearsighted); A astigmatism; H hyperopia (farsighted); P presbyopia; Mrx spectacle  prescription;  CTL contact lenses; OD right eye; OS left eye; OU both eyes  XT exotropia; ET esotropia; PEK punctate epithelial keratitis; PEE punctate epithelial erosions; DES dry eye syndrome; MGD meibomian gland dysfunction; ATs artificial tears; PFAT's preservative free artificial tears; [place] nuclear sclerotic cataract; PSC posterior subcapsular cataract; ERM epi-retinal membrane; PVD posterior vitreous detachment; RD retinal detachment; DM diabetes mellitus; DR diabetic retinopathy; NPDR non-proliferative diabetic retinopathy; PDR proliferative diabetic retinopathy; CSME clinically significant macular edema; DME diabetic macular edema; dbh dot blot hemorrhages; CWS cotton wool spot; POAG primary open angle glaucoma; C/D cup-to-disc ratio; HVF humphrey visual field; GVF goldmann visual field; OCT optical coherence tomography; IOP intraocular pressure; BRVO Branch retinal vein occlusion; CRVO central retinal vein occlusion; CRAO central retinal artery occlusion; BRAO branch retinal artery occlusion; RT retinal tear; SB scleral buckle; PPV pars plana vitrectomy; VH Vitreous hemorrhage; PRP panretinal laser photocoagulation; IVK intravitreal kenalog; VMT vitreomacular traction; MH Macular hole;  NVD neovascularization of the disc; NVE neovascularization elsewhere; AREDS age related eye disease study; ARMD age related macular degeneration; POAG primary open angle glaucoma; EBMD epithelial/anterior basement membrane dystrophy; ACIOL anterior chamber intraocular lens; IOL intraocular lens; PCIOL posterior chamber intraocular lens; Phaco/IOL phacoemulsification with intraocular lens placement; [place] photorefractive keratectomy; LASIK laser assisted in situ keratomileusis; HTN hypertension; DM diabetes mellitus; COPD chronic obstructive pulmonary disease

## 2022-10-02 ENCOUNTER — Encounter (INDEPENDENT_AMBULATORY_CARE_PROVIDER_SITE_OTHER): Payer: Self-pay | Admitting: Ophthalmology

## 2022-10-02 ENCOUNTER — Ambulatory Visit (INDEPENDENT_AMBULATORY_CARE_PROVIDER_SITE_OTHER): Payer: Medicaid Other | Admitting: Ophthalmology

## 2022-10-02 VITALS — BP 149/92

## 2022-10-02 DIAGNOSIS — H3562 Retinal hemorrhage, left eye: Secondary | ICD-10-CM | POA: Diagnosis not present

## 2022-10-02 DIAGNOSIS — I10 Essential (primary) hypertension: Secondary | ICD-10-CM

## 2022-10-02 DIAGNOSIS — H3581 Retinal edema: Secondary | ICD-10-CM

## 2022-10-02 DIAGNOSIS — H35033 Hypertensive retinopathy, bilateral: Secondary | ICD-10-CM | POA: Diagnosis not present

## 2022-10-02 DIAGNOSIS — H25813 Combined forms of age-related cataract, bilateral: Secondary | ICD-10-CM

## 2022-11-20 ENCOUNTER — Encounter (INDEPENDENT_AMBULATORY_CARE_PROVIDER_SITE_OTHER): Payer: Medicaid Other | Admitting: Ophthalmology

## 2022-12-14 NOTE — Progress Notes (Shared)
[facility] Clinic Note  12/26/2022     CHIEF COMPLAINT Patient presents for No chief complaint on file.   HISTORY OF PRESENT ILLNESS: Alan Goodman is a 57 y.o. male who presents to the clinic today for:    Pt is here on the referral of Dr. Serita Sheller at [facility], pt states Dr. Serita Sheller saw bleeding in the back of his eyes, pt denies any medical problems, states he does not take any medications, pt states he was not able to get his glasses Rx until he came to this appt  Referring physician: No referring provider defined for this encounter.  HISTORICAL INFORMATION:   Selected notes from the MEDICAL RECORD NUMBER Referred by Dr. Serita Sheller at [facility] for retinal hemorrhages OS LEE:  Ocular Hx- PMH-    CURRENT MEDICATIONS: No current outpatient medications on file. (Ophthalmic Drugs)   No current facility-administered medications for this visit. (Ophthalmic Drugs)   Current Outpatient Medications (Other)  Medication Sig   acetaminophen (TYLENOL) 325 MG tablet Take 2 tablets (650 mg total) by mouth every 6 (six) hours as needed for mild pain (or temp > 100).   docusate sodium (COLACE) 100 MG capsule Take 1 capsule (100 mg total) by mouth 2 (two) times daily as needed for mild constipation.   omeprazole (PRILOSEC OTC) 20 MG tablet Take 20 mg by mouth daily.   oxyCODONE (OXY IR/ROXICODONE) 5 MG immediate release tablet Take 1 tablet (5 mg total) by mouth every 6 (six) hours as needed for moderate pain.   polyethylene glycol (MIRALAX / GLYCOLAX) packet Take 17 g by mouth daily as needed for mild constipation.   No current facility-administered medications for this visit. (Other)   REVIEW OF SYSTEMS:   ALLERGIES No Known Allergies  PAST MEDICAL HISTORY Past Medical History:  Diagnosis Date   GERD (gastroesophageal reflux disease)    Past Surgical History:  Procedure Laterality Date   CHOLECYSTECTOMY N/A 01/30/2019   Procedure: LAPAROSCOPIC  CHOLECYSTECTOMY;  Surgeon: Leighton Ruff, MD;  Location: [facility];  Service: General;  Laterality: N/A;   INCISION AND DRAINAGE ABSCESS Left 03/31/2016   Procedure: INCISION AND DRAINAGE OF LEFT INNER THIGH ABSCESS;  Surgeon: Autumn Messing III, MD;  Location: [facility];  Service: General;  Laterality: Left;   NECK SURGERY     FAMILY HISTORY No family history on file.  SOCIAL HISTORY Social History   Tobacco Use   Smoking status: Former    Types: Cigarettes    Quit date: 01/29/2013    Years since quitting: 9.8   Smokeless tobacco: Never  Vaping Use   Vaping Use: Every day  Substance Use Topics   Alcohol use: No   Drug use: No       OPHTHALMIC EXAM:  Not recorded     IMAGING AND PROCEDURES  Imaging and Procedures for 12/26/2022          ASSESSMENT/PLAN:  No diagnosis found.  Retinal hemorrhages OS -- mild, remote RVO OS - referred by [facility] for retinal hemorrhages OS - originally presented to [facility] for updated glasses prescription -- otherwise asymptomatic - BCVA 20/40 OS - exam shows 360 peripheral DBH, some fine IRH in macula OS + dilated and tortuous venules OS - OCT shows patchy IRHM superior macula, notably tortuous and dilated venules on en face images - FA 10.30.23 shows Vascular nonperfusion temporal periphery; mild perivascular leakage from venules -- mild, remote RVO - discussed findings, prognosis,  and potential treatments - suspect mild remote RVO -- no CME at this time - no retinal or ophthalmic interventions indicated or recommended  - recommend monitoring for now - F/U 6-8 weeks -- DFE/OCT/possible injection - clear from a retina standpoint to proceed with glasses prescription - [facility] fax [phone]  2,3. Hypertensive retinopathy OU  - BP in office 140-150s / 80-90s - discussed importance of tight BP control - recommend establishment of PCP and further evaluation and management of BP  4. Mixed Cataract OU - The symptoms of cataract,  surgical options, and treatments and risks were discussed with patient. - discussed diagnosis and progression - not yet visually significant - monitor for now   Ophthalmic Meds Ordered this visit:  No orders of the defined types were placed in this encounter.    No follow-ups on file.  There are no Patient Instructions on file for this visit.   Explained the diagnoses, plan, and follow up with the patient and they expressed understanding.  Patient expressed understanding of the importance of proper follow up care.   This document serves as a record of services personally performed by Gardiner Sleeper, MD, PhD. It was created on their behalf by Renaldo Reel, [place] an ophthalmic technician. The creation of this record is the provider's dictation and/or activities during the visit.    Electronically signed by:  Renaldo Reel, COT  1.11.24 9:25 AM   Gardiner Sleeper, M.D., Ph.D. Diseases & Surgery of the Retina and Vitreous [facility] [place]: M myopia (nearsighted); A astigmatism; H hyperopia (farsighted); P presbyopia; Mrx spectacle prescription;  CTL contact lenses; OD right eye; OS left eye; OU both eyes  XT exotropia; ET esotropia; PEK punctate epithelial keratitis; PEE punctate epithelial erosions; DES dry eye syndrome; MGD meibomian gland dysfunction; ATs artificial tears; PFAT's preservative free artificial tears; [place] nuclear sclerotic cataract; PSC posterior subcapsular cataract; ERM epi-retinal membrane; PVD posterior vitreous detachment; RD retinal detachment; DM diabetes mellitus; DR diabetic retinopathy; NPDR non-proliferative diabetic retinopathy; PDR proliferative diabetic retinopathy; CSME clinically significant macular edema; DME diabetic macular edema; dbh dot blot hemorrhages; CWS cotton wool spot; POAG primary open angle glaucoma; C/D cup-to-disc ratio; HVF humphrey visual field; GVF goldmann visual field; OCT optical coherence  tomography; IOP intraocular pressure; BRVO Branch retinal vein occlusion; CRVO central retinal vein occlusion; CRAO central retinal artery occlusion; BRAO branch retinal artery occlusion; RT retinal tear; SB scleral buckle; PPV pars plana vitrectomy; VH Vitreous hemorrhage; PRP panretinal laser photocoagulation; IVK intravitreal kenalog; VMT vitreomacular traction; MH Macular hole;  NVD neovascularization of the disc; NVE neovascularization elsewhere; AREDS age related eye disease study; ARMD age related macular degeneration; POAG primary open angle glaucoma; EBMD epithelial/anterior basement membrane dystrophy; ACIOL anterior chamber intraocular lens; IOL intraocular lens; PCIOL posterior chamber intraocular lens; Phaco/IOL phacoemulsification with intraocular lens placement; [place] photorefractive keratectomy; LASIK laser assisted in situ keratomileusis; HTN hypertension; DM diabetes mellitus; COPD chronic obstructive pulmonary disease

## 2022-12-26 ENCOUNTER — Encounter (INDEPENDENT_AMBULATORY_CARE_PROVIDER_SITE_OTHER): Payer: Medicaid Other | Admitting: Ophthalmology

## 2022-12-26 ENCOUNTER — Ambulatory Visit: Payer: Self-pay | Admitting: Family Medicine

## 2022-12-26 DIAGNOSIS — H35033 Hypertensive retinopathy, bilateral: Secondary | ICD-10-CM

## 2022-12-26 DIAGNOSIS — H25813 Combined forms of age-related cataract, bilateral: Secondary | ICD-10-CM

## 2022-12-26 DIAGNOSIS — I10 Essential (primary) hypertension: Secondary | ICD-10-CM

## 2022-12-26 DIAGNOSIS — H3562 Retinal hemorrhage, left eye: Secondary | ICD-10-CM

## 2022-12-27 ENCOUNTER — Encounter: Payer: Self-pay | Admitting: Family Medicine

## 2022-12-27 ENCOUNTER — Ambulatory Visit (INDEPENDENT_AMBULATORY_CARE_PROVIDER_SITE_OTHER): Payer: Medicaid Other | Admitting: Family Medicine

## 2022-12-27 VITALS — BP 120/78 | HR 69 | Temp 98.8°F | Ht 69.0 in | Wt 277.5 lb

## 2022-12-27 DIAGNOSIS — Z1159 Encounter for screening for other viral diseases: Secondary | ICD-10-CM | POA: Diagnosis not present

## 2022-12-27 DIAGNOSIS — R0609 Other forms of dyspnea: Secondary | ICD-10-CM | POA: Diagnosis not present

## 2022-12-27 DIAGNOSIS — R0789 Other chest pain: Secondary | ICD-10-CM | POA: Diagnosis not present

## 2022-12-27 DIAGNOSIS — R1010 Upper abdominal pain, unspecified: Secondary | ICD-10-CM

## 2022-12-27 NOTE — Patient Instructions (Signed)
Welcome to [facility]! It was a pleasure meeting you today.  As discussed, Please schedule a 2 wks follow up visit today.  [facility] at [facility]  [address] on the 1st floor Phone number [phone]   get X-ray/labs at [facility].  [place]  hours 8=M-F 8:30-5.  closed 12:30-1 lunch   PLEASE NOTE:  If you had any LAB tests please let us know if you have not heard back within a few days. You may see your results on MyChart before we have a chance to review them but we will give you a call once they are reviewed by [place]. If we ordered any REFERRALS today, please let us know if you have not heard from their office within the next week.  Let us know through MyChart if you are needing REFILLS, or have your pharmacy send [place] the request. You can also use MyChart to communicate with me or any office staff.  Please try these tips to maintain a healthy lifestyle:  Eat most of your calories during the day when you are active. Eliminate processed foods including packaged sweets (pies, cakes, cookies), reduce intake of potatoes, white bread, white pasta, and white rice. Look for whole grain options, oat flour or almond flour.  Each meal should contain half fruits/vegetables, one quarter protein, and one quarter carbs (no bigger than a computer mouse).  Cut down on sweet beverages. This includes juice, soda, and sweet tea. Also watch fruit intake, though this is a healthier sweet option, it still contains natural sugar! Limit to 3 servings daily.  Drink at least 1 glass of water with each meal and aim for at least 8 glasses per day  Exercise at least 150 minutes every week.

## 2022-12-27 NOTE — Progress Notes (Signed)
New Patient Office Visit  Subjective:  Patient ID: Alan Goodman, male    DOB: 1966/09/13  Age: 57 y.o. MRN: [id number]  CC:  Chief Complaint  Patient presents with   Establish Care    Need new pcp Having issues with breathing with exertion and knee pain    HPI-here w/step-son Sharrell Ku presents for new pt DOE.  Hasn't seen pcp ever  DOE-for 3-4 months.  Even taking shower. If stands long time, knees weak.  Even at rest, hard to breathe.  Not worse to lie.  Hands and legs swell some for yrs. Occ "needle stick" pain in chest-constant and worse when active.  Better when stops walking.  On L side.  Can rad to L arm.  Gets light headed when stands up fast.  Years, tired and can fall asleep anywhere. Not while driving.  Hard to wake up. If distracted, not fall asleep, ok, but if not engaged, falls asleep.  Numbness R 4th and 5th fingers.  DOT 1 yr ago.   Past Medical History:  Diagnosis Date   GERD (gastroesophageal reflux disease)     Past Surgical History:  Procedure Laterality Date   CHOLECYSTECTOMY N/A 01/30/2019   Procedure: LAPAROSCOPIC CHOLECYSTECTOMY;  Surgeon: Leighton Ruff, MD;  Location: [facility];  Service: General;  Laterality: N/A;   INCISION AND DRAINAGE ABSCESS Left 03/31/2016   Procedure: INCISION AND DRAINAGE OF LEFT INNER THIGH ABSCESS;  Surgeon: Autumn Messing III, MD;  Location: [facility];  Service: General;  Laterality: Left;   NECK SURGERY     plate    Family History  Problem Relation Age of Onset   Cancer Mother 81 - 59       lung   Heart disease Father 29 - 30   Cancer Maternal Grandfather 21 - 79       colon   Heart disease Maternal Grandfather     Social History   Socioeconomic History   Marital status: Married    Spouse name: Not on file   Number of children: 3   Years of education: Not on file   Highest education level: Not on file  Occupational History   Not on file  Tobacco Use   Smoking status: Former    Packs/day: 4.00    Years:  15.00    Total pack years: 60.00    Types: Cigarettes, E-cigarettes    Quit date: 01/29/2013    Years since quitting: 9.9   Smokeless tobacco: Never  Vaping Use   Vaping Use: Every day  Substance and Sexual Activity   Alcohol use: No   Drug use: No   Sexual activity: Yes    Birth control/protection: Abstinence, None  Other Topics Concern   Not on file  Social History Narrative   4-3 natural and 1 step   Truck driver   Social Determinants of Radio broadcast assistant Strain: Not on file  Food Insecurity: Not on file  Transportation Needs: Not on file  Physical Activity: Not on file  Stress: Not on file  Social Connections: Not on file  Intimate Partner Violence: Not on file    ROS  ROS: Gen: no fever, chills  Skin: no rash, itching ENT: no ear pain, ear drainage, nasal congestion, rhinorrhea, sinus pressure, sore throat Eyes: +blurry vision Resp:HPI CV: HPI GI: no n/v/d/c, abd pain.  +heartburn GU: no dysuria, urgency, frequency, hematuria MSK:L knee Neuro:hpi Psych: no depression, anxiety, insomnia, SI   Objective:  Today's Vitals: BP 120/78   Pulse 69   Temp 98.8 F (37.1 C) (Temporal)   Ht 5\' 9"  (1.753 m)   Wt 277 lb 8 oz (125.9 kg)   SpO2 98%   BMI 40.98 kg/m   Physical Exam  Gen: WDWN NAD OWM.  A little pale HEENT: NCAT, conjunctiva not injected, sclera nonicteric  NECK:  supple, no thyromegaly, no nodes, no carotid bruits CARDIAC: RRR, S1S2+, no murmur. DP 2+B LUNGS: CTAB. No wheezes. coarse ABDOMEN:  BS+, soft, very tender RUQ and mod mid epi , No HSM, no masses EXT:  1+  edema MSK: limping  NEURO: A&O x3.  CN II-XII intact.  PSYCH: normal mood. Good eye contact   EKG nsr.  No st changes Lab unable to get blood. Will do at [place]:   Problem List Items Addressed This Visit   None Visit Diagnoses     Dyspnea on exertion    -  Primary   Relevant Orders   EKG 12-Lead (Completed)   DG Chest 2 View   Amylase   CBC  with Differential/Platelet   Comprehensive metabolic panel   Hemoglobin A1c   IBC + Ferritin   Lipase   Lipid panel   TSH   Other chest pain       Relevant Orders   DG Chest 2 View   Amylase   CBC with Differential/Platelet   Comprehensive metabolic panel   Hemoglobin A1c   IBC + Ferritin   Lipase   Lipid panel   TSH   Vitamin B12   Pain of upper abdomen       Relevant Orders   Amylase   CBC with Differential/Platelet   Comprehensive metabolic panel   Hemoglobin A1c   IBC + Ferritin   Lipase   Lipid panel   TSH   Vitamin B12   Encounter for hepatitis C screening test for low risk patient       Relevant Orders   Hepatitis C antibody      DOE-?copd,anemia,cardiac variant, CHF,mass, other.  Pt has 71yr pack hx and still vaping a lot(advised to quit).  Will check CBC,cmp,iron studies,b12, TSH as some edema as well. Check cxr. CP-?angina, valve,copd, GI,other, anemia-check cbc,cmp,tsh,iron studies,B12,lipids,A1C Upper abd pain-?GERD,pancreas, other(no gallbladder.).  check cbc,cmp,lipase,amylase, h pylori. Further plan depending on above.  May need chest CT, card referral, other.  ER if worse.  F/u 2 wks.   Outpatient Encounter Medications as of 12/27/2022  Medication Sig   acetaminophen (TYLENOL) 325 MG tablet Take 2 tablets (650 mg total) by mouth every 6 (six) hours as needed for mild pain (or temp > 100).   docusate sodium (COLACE) 100 MG capsule Take 1 capsule (100 mg total) by mouth 2 (two) times daily as needed for mild constipation. (Patient not taking: Reported on 12/27/2022)   omeprazole (PRILOSEC OTC) 20 MG tablet Take 20 mg by mouth daily. (Patient not taking: Reported on 12/27/2022)   oxyCODONE (OXY IR/ROXICODONE) 5 MG immediate release tablet Take 1 tablet (5 mg total) by mouth every 6 (six) hours as needed for moderate pain. (Patient not taking: Reported on 12/27/2022)   polyethylene glycol (MIRALAX / GLYCOLAX) packet Take 17 g by mouth daily as needed for mild  constipation. (Patient not taking: Reported on 12/27/2022)   No facility-administered encounter medications on file as of 12/27/2022.    Follow-up: Return in about 2 weeks (around 01/10/2023) for f/u.   Wellington Hampshire, MD

## 2022-12-28 ENCOUNTER — Other Ambulatory Visit (INDEPENDENT_AMBULATORY_CARE_PROVIDER_SITE_OTHER): Payer: Medicaid Other

## 2022-12-28 ENCOUNTER — Ambulatory Visit (INDEPENDENT_AMBULATORY_CARE_PROVIDER_SITE_OTHER)
Admission: RE | Admit: 2022-12-28 | Discharge: 2022-12-28 | Disposition: A | Discharge status: Home / Self Care | Payer: Medicaid Other | Source: Ambulatory Visit | Attending: Family Medicine | Admitting: Family Medicine

## 2022-12-28 DIAGNOSIS — R0609 Other forms of dyspnea: Secondary | ICD-10-CM

## 2022-12-28 DIAGNOSIS — R1010 Upper abdominal pain, unspecified: Secondary | ICD-10-CM

## 2022-12-28 DIAGNOSIS — Z1159 Encounter for screening for other viral diseases: Secondary | ICD-10-CM | POA: Diagnosis not present

## 2022-12-28 DIAGNOSIS — R0789 Other chest pain: Secondary | ICD-10-CM

## 2022-12-28 DIAGNOSIS — R5383 Other fatigue: Secondary | ICD-10-CM | POA: Diagnosis not present

## 2022-12-28 DIAGNOSIS — R0602 Shortness of breath: Secondary | ICD-10-CM | POA: Diagnosis not present

## 2022-12-28 LAB — LIPASE: Lipase: 14 U/L (ref 11.0–59.0)

## 2022-12-28 LAB — CBC WITH DIFFERENTIAL/PLATELET
Basophils Absolute: 0.1 10*3/uL (ref 0.0–0.1)
Basophils Relative: 1.3 % (ref 0.0–3.0)
Eosinophils Absolute: 0.2 10*3/uL (ref 0.0–0.7)
Eosinophils Relative: 2.3 % (ref 0.0–5.0)
HCT: 45 % (ref 39.0–52.0)
Hemoglobin: 15.2 g/dL (ref 13.0–17.0)
Lymphocytes Relative: 26 % (ref 12.0–46.0)
Lymphs Abs: 2 10*3/uL (ref 0.7–4.0)
MCHC: 33.9 g/dL (ref 30.0–36.0)
MCV: 87.9 fl (ref 78.0–100.0)
Monocytes Absolute: 0.8 10*3/uL (ref 0.1–1.0)
Monocytes Relative: 10.1 % (ref 3.0–12.0)
Neutro Abs: 4.6 10*3/uL (ref 1.4–7.7)
Neutrophils Relative %: 60.3 % (ref 43.0–77.0)
Platelets: 291 10*3/uL (ref 150.0–400.0)
RBC: 5.12 Mil/uL (ref 4.22–5.81)
RDW: 13.7 % (ref 11.5–15.5)
WBC: 7.6 10*3/uL (ref 4.0–10.5)

## 2022-12-28 LAB — COMPREHENSIVE METABOLIC PANEL
ALT: 12 U/L (ref 0–53)
AST: 14 U/L (ref 0–37)
Albumin: 3.8 g/dL (ref 3.5–5.2)
Alkaline Phosphatase: 99 U/L (ref 39–117)
BUN: 13 mg/dL (ref 6–23)
CO2: 30 mEq/L (ref 19–32)
Calcium: 9 mg/dL (ref 8.4–10.5)
Chloride: 102 mEq/L (ref 96–112)
Creatinine, Ser: 1.08 mg/dL (ref 0.40–1.50)
GFR: 76.82 mL/min (ref >60.00)
Glucose, Bld: 97 mg/dL (ref 70–99)
Potassium: 4.4 mEq/L (ref 3.5–5.1)
Sodium: 139 mEq/L (ref 135–145)
Total Bilirubin: 0.5 mg/dL (ref 0.2–1.2)
Total Protein: 7 g/dL (ref 6.0–8.3)

## 2022-12-28 LAB — IBC + FERRITIN
Ferritin: 233.4 ng/mL (ref 22.0–322.0)
Iron: 76 ug/dL (ref 42–165)
Saturation Ratios: 27.3 % (ref 20.0–50.0)
TIBC: 278.6 ug/dL (ref 250.0–450.0)
Transferrin: 199 mg/dL — ABNORMAL LOW (ref 212.0–360.0)

## 2022-12-28 LAB — LIPID PANEL
Cholesterol: 145 mg/dL (ref 0–200)
HDL: 42.7 mg/dL (ref >39.00)
LDL Cholesterol: 89 mg/dL (ref 0–99)
NonHDL: 101.96
Total CHOL/HDL Ratio: 3
Triglycerides: 64 mg/dL (ref 0.0–149.0)
VLDL: 12.8 mg/dL (ref 0.0–40.0)

## 2022-12-28 LAB — TSH: TSH: 3.34 u[IU]/mL (ref 0.35–5.50)

## 2022-12-28 LAB — HEMOGLOBIN A1C: Hgb A1c MFr Bld: 5.8 % (ref 4.6–6.5)

## 2022-12-28 LAB — H. PYLORI ANTIBODY, IGG: H Pylori IgG: NEGATIVE

## 2022-12-28 LAB — VITAMIN B12: Vitamin B-12: 161 pg/mL — ABNORMAL LOW (ref 211–911)

## 2022-12-28 LAB — AMYLASE: Amylase: 32 U/L (ref 27–131)

## 2022-12-28 NOTE — Progress Notes (Signed)
1.  Labs are not bad-no anemia 2.  A1C(3 month average of sugars) is elevated.  This is considered PreDiabetes.  Work on diet-decrease sugars and starches and aim for 30 minutes of exercise 5 days/week to prevent progression to diabetes  3.  B 12 is very low-should do injections weekly x 4 then monthly but if too burdensome because of distance, needs to at least do 1024mcg/day. 4.  I am doing urgent consult to cardiology as labs don't explain problem

## 2022-12-28 NOTE — Addendum Note (Signed)
Addended by: Wellington Hampshire on: 12/28/2022 08:49 PM   Modules accepted: Orders

## 2022-12-29 LAB — HEPATITIS C ANTIBODY: Hepatitis C Ab: NONREACTIVE

## 2023-01-03 ENCOUNTER — Ambulatory Visit: Payer: Medicaid Other | Attending: Cardiovascular Disease | Admitting: Cardiovascular Disease

## 2023-01-03 ENCOUNTER — Encounter: Payer: Self-pay | Admitting: Cardiovascular Disease

## 2023-01-03 VITALS — BP 124/88 | HR 79 | Ht 69.0 in | Wt 279.4 lb

## 2023-01-03 DIAGNOSIS — R072 Precordial pain: Secondary | ICD-10-CM | POA: Insufficient documentation

## 2023-01-03 DIAGNOSIS — Z720 Tobacco use: Secondary | ICD-10-CM | POA: Diagnosis not present

## 2023-01-03 DIAGNOSIS — R0609 Other forms of dyspnea: Secondary | ICD-10-CM | POA: Insufficient documentation

## 2023-01-03 DIAGNOSIS — R079 Chest pain, unspecified: Secondary | ICD-10-CM | POA: Diagnosis not present

## 2023-01-03 MED ORDER — METOPROLOL TARTRATE 100 MG PO TABS: 100.0000 mg | ORAL_TABLET | Freq: Once | ORAL | 0 refills | Status: DC

## 2023-01-03 NOTE — Patient Instructions (Signed)
Medication Instructions:  Your physician recommends that you continue on your current medications as directed. Please refer to the Current Medication list given to you today.  *If you need a refill on your cardiac medications before your next appointment, please call your pharmacy*   Lab Work: Your physician recommends that you have labs drawn today: BMET  If you have labs (blood work) drawn today and your tests are completely normal, you will receive your results only by: [place] (if you have MyChart) OR A paper copy in the mail If you have any lab test that is abnormal or we need to change your treatment, we will call you to review the results.   Testing/Procedures: Your physician has requested that you have an echocardiogram. Echocardiography is a painless test that uses sound waves to create images of your heart. It provides your doctor with information about the size and shape of your heart and how well your heart's chambers and valves are working. This procedure takes approximately one hour. There are no restrictions for this procedure. Please do NOT wear cologne, perfume, aftershave, or lotions (deodorant is allowed). Please arrive 15 minutes prior to your appointment time.     Follow-Up: At [facility], you and your health needs are our priority.  As part of our continuing mission to provide you with exceptional heart care, we have created designated Provider Care Teams.  These Care Teams include your primary Cardiologist (physician) and Advanced Practice Providers (APPs -  Physician Assistants and Nurse Practitioners) who all work together to provide you with the care you need, when you need it.  We recommend signing up for the patient portal called "MyChart".  Sign up information is provided on this After Visit Summary.  MyChart is used to connect with patients for Virtual Visits (Telemedicine).  Patients are able to view lab/test results, encounter notes,  upcoming appointments, etc.  Non-urgent messages can be sent to your provider as well.   To learn more about what you can do with MyChart, go to [email or web address].    Your next appointment:   4-5 week(s)  Provider:   Quay Burow, MD   Other Instructions   Your cardiac CT will be scheduled at the below location:   [facility] [address] [place], [place] [address] [phone]   If scheduled at [facility], please arrive at the [facility] and Children's Entrance (Entrance C2) of [facility] 30 minutes prior to test start time. You can use the FREE valet parking offered at entrance C (encouraged to control the heart rate for the test)  Proceed to the [facility] Radiology Department (first floor) to check-in and test prep.  All radiology patients and guests should use entrance C2 at [facility], accessed from [facility], even though the hospital's physical address listed is [address]     Please follow these instructions carefully (unless otherwise directed):  Hold all erectile dysfunction medications at least 3 days (72 hrs) prior to test. (Ie viagra, cialis, sildenafil, tadalafil, etc) We will administer nitroglycerin during this exam.   On the Night Before the Test: Be sure to Drink plenty of water. Do not consume any caffeinated/decaffeinated beverages or chocolate 12 hours prior to your test. Do not take any antihistamines 12 hours prior to your test.  On the Day of the Test: Drink plenty of water until 1 hour prior to the test. Do not eat any food 1 hour prior to  test. You may take your regular medications prior to the test.  Take metoprolol (Lopressor) 100mg  two hours prior to test. HOLD Furosemide/Hydrochlorothiazide morning of the test.       After the Test: Drink plenty of water. After receiving IV contrast, you may experience a mild flushed feeling. This is normal. On occasion, you may  experience a mild rash up to 24 hours after the test. This is not dangerous. If this occurs, you can take Benadryl 25 mg and increase your fluid intake. If you experience trouble breathing, this can be serious. If it is severe call 911 IMMEDIATELY. If it is mild, please call our office. If you take any of these medications: Glipizide/Metformin, Avandament, Glucavance, please do not take 48 hours after completing test unless otherwise instructed.  We will call to schedule your test 2-4 weeks out understanding that some insurance companies will need an authorization prior to the service being performed.   For non-scheduling related questions, please contact the cardiac imaging nurse navigator should you have any questions/concerns: Marchia Bond, Cardiac Imaging Nurse Navigator Gordy Clement, Cardiac Imaging Nurse Navigator  Heart and Vascular Services Direct Office Dial: [phone]   For scheduling needs, including cancellations and rescheduling, please call [place], [phone].

## 2023-01-03 NOTE — Assessment & Plan Note (Signed)
New onset dyspnea exertion 3 to 4 months ago for unclear reasons.  Certainly possible that this is lung related given his long history tobacco abuse although he stopped 9 years ago and recently started.  I suspect this is more likely cardiovascular.  I am going to get a 2D echo to further evaluate.

## 2023-01-03 NOTE — Assessment & Plan Note (Signed)
Exertional substernal chest pressure over the last 3 to 4 months associated with dyspnea.  I am going to get a coronary CTA to further evaluate

## 2023-01-03 NOTE — Progress Notes (Signed)
01/03/2023 Alan Goodman   [birth year]  [id number]  Primary Physician Tawnya Crook, MD Primary Cardiologist: Lorretta Harp MD Lupe Carney, [place]  HPI:  Alan Goodman is a 57 y.o. severely overweight married Caucasian male father of 4 children, grandfather of 4 grandchildren who works as a Programmer, systems.  He was referred by Dr. Cherlynn Kaiser, his PCP, because of dyspnea on exertion and chest pain.  His only risk factors include greater than 100 pack years of tobacco abuse having quit 9 years ago.  There is no family history for heart disease , and he has never had heart attack or stroke.  He is complained of increasing dyspnea and substernal chest pressure on exertion over the last 3 to 4 months.   Current Meds  Medication Sig   acetaminophen (TYLENOL) 325 MG tablet Take 2 tablets (650 mg total) by mouth every 6 (six) hours as needed for mild pain (or temp > 100).   docusate sodium (COLACE) 100 MG capsule Take 1 capsule (100 mg total) by mouth 2 (two) times daily as needed for mild constipation.   omeprazole (PRILOSEC OTC) 20 MG tablet Take 20 mg by mouth daily.   oxyCODONE (OXY IR/ROXICODONE) 5 MG immediate release tablet Take 1 tablet (5 mg total) by mouth every 6 (six) hours as needed for moderate pain.   polyethylene glycol (MIRALAX / GLYCOLAX) packet Take 17 g by mouth daily as needed for mild constipation.     No Known Allergies  Social History   Socioeconomic History   Marital status: Married    Spouse name: Not on file   Number of children: 3   Years of education: Not on file   Highest education level: Not on file  Occupational History   Not on file  Tobacco Use   Smoking status: Former    Packs/day: 4.00    Years: 15.00    Total pack years: 60.00    Types: Cigarettes, E-cigarettes    Quit date: 01/29/2013    Years since quitting: 9.9   Smokeless tobacco: Never  Vaping Use   Vaping Use: Every day  Substance and Sexual Activity   Alcohol  use: No   Drug use: No   Sexual activity: Yes    Birth control/protection: Abstinence, None  Other Topics Concern   Not on file  Social History Narrative   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  Cardiovascular: negative for chest pain, dyspnea on exertion, edema, orthopnea, palpitations, paroxysmal nocturnal dyspnea or shortness of breath Dermatological: negative for rash Respiratory: negative for cough or wheezing Urologic: negative for hematuria Abdominal: negative for nausea, vomiting, diarrhea, bright red blood per rectum, melena, or hematemesis Neurologic: negative for visual changes, syncope, or dizziness All other systems reviewed and are otherwise negative except as noted above.    Blood pressure 124/88, pulse 79, height 5\' 9"  (1.753 m), weight 279 lb 6.4 oz (126.7 kg), SpO2 97 %.  General appearance: alert and no distress Neck: no adenopathy, no carotid bruit, no JVD, supple, symmetrical, trachea midline, and thyroid not  enlarged, symmetric, no tenderness/mass/nodules Lungs: clear to auscultation bilaterally Heart: regular rate and rhythm, S1, S2 normal, no murmur, click, rub or gallop Extremities: extremities normal, atraumatic, no cyanosis or edema Pulses: 2+ and symmetric Skin: Skin color, texture, turgor normal. No rashes or lesions Neurologic: Grossly normal  EKG sinus rhythm at 79 without ST or T wave changes.  Personally reviewed this EKG.  ASSESSMENT AND PLAN:   Tobacco abuse Discontinue tobacco abuse 9 years ago having smoked greater than 100 pack years prior to that.  Dyspnea on exertion New onset  dyspnea exertion 3 to 4 months ago for unclear reasons.  Certainly possible that this is lung related given his long history tobacco abuse although he stopped 9 years ago and recently started.  I suspect this is more likely cardiovascular.  I am going to get a 2D echo to further evaluate.  Chest pain of uncertain etiology Exertional substernal chest pressure over the last 3 to 4 months associated with dyspnea.  I am going to get a coronary CTA to further evaluate     Lorretta Harp MD [facility] 01/03/2023 12:20 PM

## 2023-01-03 NOTE — Assessment & Plan Note (Signed)
Discontinue tobacco abuse 9 years ago having smoked greater than 100 pack years prior to that.

## 2023-01-04 LAB — BASIC METABOLIC PANEL
BUN/Creatinine Ratio: 11 (ref 9–20)
BUN: 13 mg/dL (ref 6–24)
CO2: 24 mmol/L (ref 20–29)
Calcium: 9.3 mg/dL (ref 8.7–10.2)
Chloride: 100 mmol/L (ref 96–106)
Creatinine, Ser: 1.21 mg/dL (ref 0.76–1.27)
Glucose: 96 mg/dL (ref 70–99)
Potassium: 4.8 mmol/L (ref 3.5–5.2)
Sodium: 141 mmol/L (ref 134–144)
eGFR: 70 mL/min/{1.73_m2} (ref >59)

## 2023-01-08 ENCOUNTER — Telehealth (HOSPITAL_COMMUNITY): Payer: Self-pay | Admitting: *Deleted

## 2023-01-08 NOTE — Telephone Encounter (Signed)
Patient's daughter calling about her father's upcoming cardiac imaging study; pt's daughter verbalizes understanding of appt date/time, parking situation and where to check in, pre-test NPO status and medications ordered, and verified current allergies; name and call back number provided for further questions should they arise  Gordy Clement RN Navigator Cardiac Imaging [facility] [phone] office [phone] cell  Patient to take 100mg  metoprolol tartrate two hours prior to his cardiac CT scan. Patient's daughter is aware he is to arrive at 1:30pm.

## 2023-01-09 ENCOUNTER — Ambulatory Visit (HOSPITAL_COMMUNITY)
Admission: RE | Admit: 2023-01-09 | Discharge: 2023-01-09 | Disposition: A | Discharge status: Home / Self Care | Payer: Medicaid Other | Source: Ambulatory Visit | Attending: Cardiovascular Disease | Admitting: Cardiovascular Disease

## 2023-01-09 DIAGNOSIS — R072 Precordial pain: Secondary | ICD-10-CM | POA: Diagnosis not present

## 2023-01-09 MED ORDER — NITROGLYCERIN 0.4 MG SL SUBL
0.4000 mg | SUBLINGUAL_TABLET | SUBLINGUAL | Status: DC | PRN
  Administered 2023-01-09: 0.8 mg via SUBLINGUAL

## 2023-01-09 MED ORDER — IOHEXOL 350 MG/ML SOLN
100.0000 mL | Freq: Once | INTRAVENOUS | Status: AC | PRN
  Administered 2023-01-09: 100 mL via INTRAVENOUS

## 2023-01-09 MED ORDER — NITROGLYCERIN 0.4 MG SL SUBL
SUBLINGUAL_TABLET | SUBLINGUAL | Status: AC
  Filled 2023-01-09: qty 2

## 2023-01-10 ENCOUNTER — Ambulatory Visit: Payer: Medicaid Other | Admitting: Family Medicine

## 2023-01-26 ENCOUNTER — Ambulatory Visit (INDEPENDENT_AMBULATORY_CARE_PROVIDER_SITE_OTHER): Payer: Medicaid Other | Admitting: Family Medicine

## 2023-01-26 ENCOUNTER — Encounter: Payer: Self-pay | Admitting: Family Medicine

## 2023-01-26 VITALS — BP 132/84 | HR 76 | Temp 98.2°F | Ht 69.0 in | Wt 279.4 lb

## 2023-01-26 DIAGNOSIS — R079 Chest pain, unspecified: Secondary | ICD-10-CM | POA: Diagnosis not present

## 2023-01-26 DIAGNOSIS — R1011 Right upper quadrant pain: Secondary | ICD-10-CM | POA: Diagnosis not present

## 2023-01-26 DIAGNOSIS — R7303 Prediabetes: Secondary | ICD-10-CM

## 2023-01-26 DIAGNOSIS — R0609 Other forms of dyspnea: Secondary | ICD-10-CM

## 2023-01-26 MED ORDER — ALBUTEROL SULFATE HFA 108 (90 BASE) MCG/ACT IN AERS: 2.0000 | INHALATION_SPRAY | Freq: Four times a day (QID) | RESPIRATORY_TRACT | 1 refills | Status: DC | PRN

## 2023-01-26 NOTE — Progress Notes (Unsigned)
Subjective:     Patient ID: Alan Goodman, male    DOB: 16-Apr-1966, 57 y.o.   MRN: [id number]  Chief Complaint  Patient presents with   Follow-up    2 week follow-up     HPI-here w/son  DOE-same.  Saw Card.  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  Hard to walk far.   Still some CP w/longer distances. Has echo on 2/26 Asthma as child. Still vaping Edema better w/drinking more water Vitamin B12 deficiency-taking otc B12  Health Maintenance Due  Topic Date Due   DTaP/Tdap/Td (1 - Tdap) Never done   COLONOSCOPY (Pts 45-64yr Insurance coverage will need to be confirmed)  Never done    Past Medical History:  Diagnosis Date   GERD (gastroesophageal reflux disease)     Past Surgical History:  Procedure Laterality Date   CHOLECYSTECTOMY N/A 01/30/2019   Procedure: LAPAROSCOPIC CHOLECYSTECTOMY;  Surgeon: TLeighton Ruff MD;  Location: [facility];  Service: General;  Laterality: N/A;   INCISION AND DRAINAGE ABSCESS Left 03/31/2016   Procedure: INCISION AND DRAINAGE OF LEFT INNER THIGH ABSCESS;  Surgeon: PAutumn MessingIII, MD;  Location: [facility];  Service: General;  Laterality: Left;   NECK SURGERY     plate    Outpatient Medications Prior to Visit  Medication Sig Dispense Refill   acetaminophen (TYLENOL) 325 MG tablet Take 2 tablets (650 mg total) by mouth every 6 (six) hours as needed for mild pain (or temp > 100). 30 tablet 0   cyanocobalamin (VITAMIN B12) 1000 MCG tablet Take 1,000 mcg by mouth daily.     docusate sodium (COLACE) 100 MG capsule Take 1 capsule (100 mg total) by mouth 2 (two) times daily as needed for mild constipation. 10 capsule 0   omeprazole (PRILOSEC OTC) 20 MG tablet Take 20 mg by mouth daily.     oxyCODONE (OXY IR/ROXICODONE) 5 MG immediate release tablet Take 1 tablet (5 mg total) by mouth every 6 (six) hours as needed for moderate pain. 15 tablet 0   polyethylene glycol (MIRALAX / GLYCOLAX) packet Take 17 g by mouth daily as needed for mild constipation. 14 each 0    metoprolol tartrate (LOPRESSOR) 100 MG tablet Take 1 tablet (100 mg total) by mouth once for 1 dose. Take 2 hours prior to procedure. 1 tablet 0   No facility-administered medications prior to visit.    No Known Allergies ROS neg/noncontributory except as noted HPI/below      Objective:     BP 132/84   Pulse 76   Temp 98.2 F (36.8 C) (Temporal)   Ht '5\' 9"'$  (1.753 m)   Wt 279 lb 6 oz (126.7 kg)   SpO2 99%   BMI 41.26 kg/m  Wt Readings from Last 3 Encounters:  01/26/23 279 lb 6 oz (126.7 kg)  01/03/23 279 lb 6.4 oz (126.7 kg)  12/27/22 277 lb 8 oz (125.9 kg)    Physical Exam   Gen: WDWN NAD HEENT: NCAT, conjunctiva not injected, sclera nonicteric NECK:  supple, no thyromegaly, no nodes, no carotid bruits CARDIAC: RRR, S1S2+, no murmur. DP 2+B LUNGS: CTAB. No wheezes ABDOMEN:  BS+, soft, very tender RUQ, No HSM, no masses EXT:  tr edema MSK: no gross abnormalities.  NEURO: A&O x3.  CN II-XII intact.  PSYCH: normal mood. Good eye contact  Reviewed labs/CTA     Assessment & Plan:   Problem List Items Addressed This Visit       Other   Dyspnea on exertion -  Primary   Relevant Orders   Ambulatory referral to Pulmonology   Chest pain of uncertain etiology   Other Visit Diagnoses     Prediabetes       RUQ pain       Relevant Orders   US ABDOMEN LIMITED RUQ (LIVER/GB)     1.  Dyspnea on exertion-obstructive coronary artery disease ruled out.  This may be more pulmonary.  He does have a history of asthma.  He continues to vape.  Long discussion with son present of the need to stop vaping.  Discussed downhill course if he continues this path.  Will trial albuterol inhaler every 6 hours as needed.  Refer to pulmonary.  Advised to start working on some very mild exercise.  He does have an echo scheduled for 01/29/2023.  If that is normal, probably okay to go back to work.  (Patient is a Administrator) 2.  Chest pain-more with longer distances.  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  Has an echocardiogram scheduled on Monday.  Advised to notify cardiology of his angina. 3.  Prediabetes-Long discussion on diet/exercise/risk reduction.  He really needs to change his lifestyle.  Advised to do short walks (which may be 2-3 laps around his truck) 4.  Right upper quadrant pain-he does not have a gallbladder.  This may be scar tissue, other.  Labs were unrevealing.  Check ultrasound  F/u 28m Meds ordered this encounter  Medications   albuterol (VENTOLIN HFA) 108 (90 Base) MCG/ACT inhaler    Sig: Inhale 2 puffs into the lungs every 6 (six) hours as needed for wheezing or shortness of breath.    Dispense:  8 g    Refill:  1    AWellington Hampshire MD

## 2023-01-26 NOTE — Patient Instructions (Addendum)
It was very nice to see you today!  You have a referral in for [facility].  Please call them at [phone] to set up your appointment if you have not been contacted by their office within 3-5 business days.    [place] [id number] for mammogram, other studies -ordering ultrasound  Work on exercise, diet.    PLEASE NOTE:  If you had any lab tests please let us know if you have not heard back within a few days. You may see your results on MyChart before we have a chance to review them but we will give you a call once they are reviewed by [place]. If we ordered any referrals today, please let us know if you have not heard from their office within the next week.   Please try these tips to maintain a healthy lifestyle:  Eat most of your calories during the day when you are active. Eliminate processed foods including packaged sweets (pies, cakes, cookies), reduce intake of potatoes, white bread, white pasta, and white rice. Look for whole grain options, oat flour or almond flour.  Each meal should contain half fruits/vegetables, one quarter protein, and one quarter carbs (no bigger than a computer mouse).  Cut down on sweet beverages. This includes juice, soda, and sweet tea. Also watch fruit intake, though this is a healthier sweet option, it still contains natural sugar! Limit to 3 servings daily.  Drink at least 1 glass of water with each meal and aim for at least 8 glasses per day  Exercise at least 150 minutes every week.

## 2023-01-29 ENCOUNTER — Ambulatory Visit (HOSPITAL_COMMUNITY): Payer: Medicaid Other | Attending: Cardiovascular Disease

## 2023-01-29 DIAGNOSIS — R072 Precordial pain: Secondary | ICD-10-CM | POA: Diagnosis not present

## 2023-01-29 DIAGNOSIS — Z720 Tobacco use: Secondary | ICD-10-CM | POA: Diagnosis not present

## 2023-01-29 DIAGNOSIS — R079 Chest pain, unspecified: Secondary | ICD-10-CM | POA: Insufficient documentation

## 2023-01-29 DIAGNOSIS — R0609 Other forms of dyspnea: Secondary | ICD-10-CM | POA: Diagnosis not present

## 2023-01-29 LAB — ECHOCARDIOGRAM COMPLETE
Area-P 1/2: 4.21 cm2
S' Lateral: 3.2 cm

## 2023-01-31 ENCOUNTER — Telehealth: Payer: Self-pay

## 2023-01-31 ENCOUNTER — Encounter: Payer: Self-pay | Admitting: Cardiovascular Disease

## 2023-01-31 ENCOUNTER — Telehealth: Payer: Self-pay | Admitting: Cardiovascular Disease

## 2023-01-31 ENCOUNTER — Ambulatory Visit: Payer: Medicaid Other | Attending: Cardiovascular Disease | Admitting: Cardiovascular Disease

## 2023-01-31 VITALS — BP 128/88 | HR 77 | Ht 69.0 in | Wt 279.6 lb

## 2023-01-31 DIAGNOSIS — R0609 Other forms of dyspnea: Secondary | ICD-10-CM | POA: Diagnosis not present

## 2023-01-31 DIAGNOSIS — R931 Abnormal findings on diagnostic imaging of heart and coronary circulation: Secondary | ICD-10-CM | POA: Diagnosis not present

## 2023-01-31 MED ORDER — ASPIRIN 81 MG PO TBEC: 81.0000 mg | DELAYED_RELEASE_TABLET | Freq: Every day | ORAL | Status: AC

## 2023-01-31 MED ORDER — ATORVASTATIN CALCIUM 20 MG PO TABS: 20.0000 mg | ORAL_TABLET | Freq: Every day | ORAL | 3 refills | Status: DC

## 2023-01-31 NOTE — Telephone Encounter (Signed)
Tried to call pt back. Unable to leave message. Letter to return to work re-faxed to number provided with confirmation of successful fax received. Will also place copy of letter at front desk for pt to pick up.

## 2023-01-31 NOTE — Telephone Encounter (Signed)
error 

## 2023-01-31 NOTE — Telephone Encounter (Signed)
Patient called back stating fax was not received and requested document be re-faxed to fax# [phone] or emailed to [email or web address].

## 2023-01-31 NOTE — Telephone Encounter (Signed)
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 Please advise

## 2023-01-31 NOTE — Progress Notes (Signed)
Alan Goodman returns today for follow-up of his noninvasive test in the evaluation of dyspnea.  He is accompanied by [name].  His 2D echo was essentially normal except for grade 2 diastolic dysfunction.  His ascending aorta aorta measured 40 mmHg which will need to be followed on an annual basis.  There were no valvular abnormalities.  He did have a coronary CTA that revealed a coronary calcium score of 54 with mild nonobstructive disease.  His most recent lipid profile performed 12/28/2022 revealed an LDL of 89 not on statin therapy.  I suggested that he begin an enteric-coated baby aspirin, start atorvastatin 20 mg a day and we will recheck a lipid liver profile in 3 months..  He will need a 2D echo performed annually.  I am going to have him see an APP back in 3 and me back in 6 months.  Lorretta Harp, M.D., [place], [facility], [name] [address] [place], [place]  [address]  [phone] 01/31/2023 11:56 AM

## 2023-01-31 NOTE — Patient Instructions (Addendum)
Medication Instructions:  Your physician has recommended you make the following change in your medication:   -Start aspirin EC '81mg'$  once daily.  -Start atorvastatin (lipitor) '20mg'$  once daily.  *If you need a refill on your cardiac medications before your next appointment, please call your pharmacy*   Lab Work: Your physician recommends that you return for lab work in: 3 months for FASTING lipid/liver panel  If you have labs (blood work) drawn today and your tests are completely normal, you will receive your results only by: [place] (if you have MyChart) OR A paper copy in the mail If you have any lab test that is abnormal or we need to change your treatment, we will call you to review the results.   Follow-Up: At [facility], you and your health needs are our priority.  As part of our continuing mission to provide you with exceptional heart care, we have created designated Provider Care Teams.  These Care Teams include your primary Cardiologist (physician) and Advanced Practice Providers (APPs -  Physician Assistants and Nurse Practitioners) who all work together to provide you with the care you need, when you need it.  We recommend signing up for the patient portal called "MyChart".  Sign up information is provided on this After Visit Summary.  MyChart is used to connect with patients for Virtual Visits (Telemedicine).  Patients are able to view lab/test results, encounter notes, upcoming appointments, etc.  Non-urgent messages can be sent to your provider as well.   To learn more about what you can do with MyChart, go to [email or web address].    Your next appointment:   3 month(s)  Provider:   Fabian Sharp, PA-C, Sande Rives, PA-C, Caron Presume, PA-C, Jory Sims, DNP, ANP, Almyra Deforest, PA-C, or Diona Browner, NP     Then, Quay Burow, MD will plan to see you again in 6 month(s).    Other Instructions Heart-Healthy Eating Plan Eating a healthy diet  is important for the health of your heart. A heart-healthy eating plan includes: Eating less unhealthy fats. Eating more healthy fats. Eating less salt in your food. Salt is also called sodium. Making other changes in your diet. Talk with your doctor or a diet specialist (dietitian) to create an eating plan that is right for you. What are tips for following this plan? Cooking Avoid frying your food. Try to bake, boil, grill, or broil it instead. You can also reduce fat by: Removing the skin from poultry. Removing all visible fats from meats. Steaming vegetables in water or broth. Meal planning  At meals, divide your plate into four equal parts: Fill one-half of your plate with vegetables and green salads. Fill one-fourth of your plate with whole grains. Fill one-fourth of your plate with lean protein foods. Eat 2-4 cups of vegetables per day. One cup of vegetables is: 1 cup (91 g) broccoli or cauliflower florets. 2 medium carrots. 1 large bell pepper. 1 large sweet potato. 1 large tomato. 1 medium white potato. 2 cups (150 g) raw leafy greens. Eat 1-2 cups of fruit per day. One cup of fruit is: 1 small apple 1 large banana 1 cup (237 g) mixed fruit, 1 large orange,  cup (82 g) dried fruit, 1 cup (240 mL) 100% fruit juice. Eat more foods that have soluble fiber. These are apples, broccoli, carrots, beans, peas, and barley. Try to get 20-30 g of fiber per day. Eat 4-5 servings of nuts, legumes, and seeds per week: 1 serving of  dried beans or legumes equals  cup (90 g) cooked. 1 serving of nuts is  oz (12 almonds, 24 pistachios, or 7 walnut halves). 1 serving of seeds equals  oz (8 g). General information Eat more home-cooked food. Eat less restaurant, buffet, and fast food. Limit or avoid alcohol. Limit foods that are high in starch and sugar. Avoid fried foods. Lose weight if you are overweight. Keep track of how much salt (sodium) you eat. This is important if you  have high blood pressure. Ask your doctor to tell you more about this. Try to add vegetarian meals each week. Fats Choose healthy fats. These include olive oil and canola oil, flaxseeds, walnuts, almonds, and seeds. Eat more omega-3 fats. These include salmon, mackerel, sardines, tuna, flaxseed oil, and ground flaxseeds. Try to eat fish at least 2 times each week. Check food labels. Avoid foods with trans fats or high amounts of saturated fat. Limit saturated fats. These are often found in animal products, such as meats, butter, and cream. These are also found in plant foods, such as palm oil, palm kernel oil, and coconut oil. Avoid foods with partially hydrogenated oils in them. These have trans fats. Examples are stick margarine, some tub margarines, cookies, crackers, and other baked goods. What foods should I eat? Fruits All fresh, canned (in natural juice), or frozen fruits. Vegetables Fresh or frozen vegetables (raw, steamed, roasted, or grilled). Green salads. Grains Most grains. Choose whole wheat and whole grains most of the time. Rice and pasta, including brown rice and pastas made with whole wheat. Meats and other proteins Lean, well-trimmed beef, veal, pork, and lamb. Chicken and [place] without skin. All fish and shellfish. Wild duck, rabbit, pheasant, and venison. Egg whites or low-cholesterol egg substitutes. Dried beans, peas, lentils, and tofu. Seeds and most nuts. Dairy Low-fat or nonfat cheeses, including ricotta and mozzarella. Skim or 1% milk that is liquid, powdered, or evaporated. Buttermilk that is made with low-fat milk. Nonfat or low-fat yogurt. Fats and oils Non-hydrogenated (trans-free) margarines. Vegetable oils, including soybean, sesame, sunflower, olive, peanut, safflower, corn, canola, and cottonseed. Salad dressings or mayonnaise made with a vegetable oil. Beverages Mineral water. Coffee and tea. Diet carbonated beverages. Sweets and desserts Sherbet,  gelatin, and fruit ice. Small amounts of dark chocolate. Limit all sweets and desserts. Seasonings and condiments All seasonings and condiments. The items listed above may not be a complete list of foods and drinks you can eat. Contact a dietitian for more options. What foods should I avoid? Fruits Canned fruit in heavy syrup. Fruit in cream or butter sauce. Fried fruit. Limit coconut. Vegetables Vegetables cooked in cheese, cream, or butter sauce. Fried vegetables. Grains Breads that are made with saturated or trans fats, oils, or whole milk. Croissants. Sweet rolls. Donuts. High-fat crackers, such as cheese crackers. Meats and other proteins Fatty meats, such as hot dogs, ribs, sausage, bacon, rib-eye roast or steak. High-fat deli meats, such as salami and bologna. Caviar. Domestic duck and goose. Organ meats, such as liver. Dairy Cream, sour cream, cream cheese, and creamed cottage cheese. Whole-milk cheeses. Whole or 2% milk that is liquid, evaporated, or condensed. Whole buttermilk. Cream sauce or high-fat cheese sauce. Yogurt that is made from whole milk. Fats and oils Meat fat, or shortening. Cocoa butter, hydrogenated oils, palm oil, coconut oil, palm kernel oil. Solid fats and shortenings, including bacon fat, salt pork, lard, and butter. Nondairy cream substitutes. Salad dressings with cheese or sour cream. Beverages Regular sodas and juice  drinks with added sugar. Sweets and desserts Frosting. Pudding. Cookies. Cakes. Pies. Milk chocolate or white chocolate. Buttered syrups. Full-fat ice cream or ice cream drinks. The items listed above may not be a complete list of foods and drinks to avoid. Contact a dietitian for more information. Summary Heart-healthy meal planning includes eating less unhealthy fats, eating more healthy fats, and making other changes in your diet. Eat a balanced diet. This includes fruits and vegetables, low-fat or nonfat dairy, lean protein, nuts and  legumes, whole grains, and heart-healthy oils and fats. This information is not intended to replace advice given to you by your health care provider. Make sure you discuss any questions you have with your health care provider. Document Revised: 12/26/2021 Document Reviewed: 12/26/2021 Elsevier Patient Education  [place].

## 2023-01-31 NOTE — Telephone Encounter (Signed)
Done. See additional phone encounter for more details.

## 2023-01-31 NOTE — Telephone Encounter (Signed)
Pt's son, Erlene Quan comes back into the office asking for a return to work letter for the pt. Details received by Jodene Nam. Called pt's son back to see how they would like to get letter. Fax number to send letter received. [phone]  Letter sent to this fax number. Brandon verbalizes understanding.   Confirmation of successful fax received.

## 2023-02-01 ENCOUNTER — Telehealth: Payer: Self-pay | Admitting: Cardiovascular Disease

## 2023-02-01 ENCOUNTER — Encounter: Payer: Self-pay | Admitting: Cardiovascular Disease

## 2023-02-01 NOTE — Telephone Encounter (Signed)
Spoke to pt daughter, she will like for someone to fax over the return to work letter to [phone]. Will forward to MD and nurse.

## 2023-02-01 NOTE — Telephone Encounter (Signed)
Pt daughter calling in regards to return to work note being faxed. Note was faxed to incorrect fax number, the correct fax number is [phone]. Pt needs note faxed today if possible and would like a callback once fax has been sent. Please advise.

## 2023-02-01 NOTE — Telephone Encounter (Signed)
Pt's daughter is calling to follow up on the return to work letter. Pt's daughter state that it is urgent for them to get the return to work letter today. She's requesting for it to be uploaded into the patient's mychart and/or faxed to [phone].

## 2023-02-01 NOTE — Telephone Encounter (Signed)
Work note in [employer] patient to discuss (daughter not on DPR)-request letter scanned to email.    Letter scanned to patient email: [email or web address]

## 2023-02-01 NOTE — Telephone Encounter (Signed)
See phone note

## 2023-02-01 NOTE — Telephone Encounter (Signed)
Pt's daughter calling back regarding Return To Work letter. She states that if letter is able to be emailed to pt they would prefer that to be done, being that pt's employer is needing this letter today. Please advise   Email: [email or web address]

## 2023-02-09 ENCOUNTER — Institutional Professional Consult (permissible substitution): Payer: Medicaid Other | Admitting: Internal Medicine

## 2023-02-23 ENCOUNTER — Institutional Professional Consult (permissible substitution): Payer: Medicaid Other | Admitting: Internal Medicine

## 2023-02-27 ENCOUNTER — Other Ambulatory Visit: Payer: Medicaid Other

## 2023-03-20 ENCOUNTER — Telehealth: Payer: Self-pay | Admitting: Family Medicine

## 2023-03-20 ENCOUNTER — Other Ambulatory Visit: Payer: Self-pay | Admitting: *Deleted

## 2023-03-20 MED ORDER — ALBUTEROL SULFATE HFA 108 (90 BASE) MCG/ACT IN AERS: 2.0000 | INHALATION_SPRAY | Freq: Four times a day (QID) | RESPIRATORY_TRACT | 1 refills | Status: DC | PRN

## 2023-03-20 NOTE — Telephone Encounter (Signed)
Rx sent to the pharmacy.

## 2023-03-20 NOTE — Telephone Encounter (Signed)
Prescription Request  03/20/2023  LOV: 01/26/2023  What is the name of the medication or equipment? albuterol (VENTOLIN HFA) 108 (90 Base) MCG/ACT inhaler   Have you contacted your pharmacy to request a refill? Yes   Which pharmacy would you like this sent to?  [facility] [address], [place] - [address] [place] [address] Phone: [phone] Fax: [phone]   Pt is leaving town for work on 4/18.  Patient notified that their request is being sent to the clinical staff for review and that they should receive a response within 2 business days.   Please advise at Mobile [phone] (mobile)

## 2023-03-23 ENCOUNTER — Institutional Professional Consult (permissible substitution): Payer: Medicaid Other | Admitting: Internal Medicine

## 2023-04-20 ENCOUNTER — Institutional Professional Consult (permissible substitution): Payer: Medicaid Other | Admitting: Internal Medicine

## 2023-04-25 ENCOUNTER — Ambulatory Visit: Payer: Self-pay | Admitting: Family Medicine

## 2023-05-01 ENCOUNTER — Ambulatory Visit: Payer: Self-pay | Attending: Nurse Practitioner | Admitting: Nurse Practitioner

## 2023-05-01 NOTE — Progress Notes (Deleted)
Office Visit    Patient Name: Alan Goodman Date of Encounter: 05/01/2023  Primary Care Provider:  Jeani Sow, MD Primary Cardiologist:  None  Chief Complaint    57 year old male with a history of elevated coronary artery calcium score, dyspnea on exertion, mild dilation of the ascending aorta, hyperlipidemia, former tobacco use, and GERD who presents for follow-up related to chest pain and dyspnea on exertion.  Past Medical History    Past Medical History:  Diagnosis Date   GERD (gastroesophageal reflux disease)    Past Surgical History:  Procedure Laterality Date   CHOLECYSTECTOMY N/A 01/30/2019   Procedure: LAPAROSCOPIC CHOLECYSTECTOMY;  Surgeon: Romie Levee, MD;  Location: [facility];  Service: General;  Laterality: N/A;   INCISION AND DRAINAGE ABSCESS Left 03/31/2016   Procedure: INCISION AND DRAINAGE OF LEFT INNER THIGH ABSCESS;  Surgeon: Chevis Pretty III, MD;  Location: [facility];  Service: General;  Laterality: Left;   NECK SURGERY     plate    Allergies  No Known Allergies   Labs/Other Studies Reviewed    The following studies were reviewed today:  Cardiac Studies & Procedures       ECHOCARDIOGRAM  ECHOCARDIOGRAM COMPLETE 01/29/2023  Narrative ECHOCARDIOGRAM REPORT    Patient Name:   Alan Goodman Date of Exam: 01/29/2023 Medical Rec #:  [id number]        Height:       69.0 in Accession #:    [id number]       Weight:       279.4 lb Date of Birth:  July 17, 1966        BSA:          2.381 m Patient Age:    56 years         BP:           132/84 mmHg Patient Gender: M                HR:           62 bpm. Exam Location:  [address]  Procedure: 2D Echo, Cardiac Doppler, Color Doppler and 3D Echo  Indications:    R06.9 DOE; R07.9* Chest pain, unspecified  History:        Patient has no prior history of Echocardiogram examinations. Risk Factors:Former Smoker.  Sonographer:    Thurman Coyer RDCS Referring Phys: [phone] JONATHAN J  BERRY  IMPRESSIONS   1. Left ventricular ejection fraction, by estimation, is 50 to 55%. The left ventricle has low normal function. The left ventricle has no regional wall motion abnormalities. Left ventricular diastolic parameters are consistent with Grade II diastolic dysfunction (pseudonormalization). 2. Right ventricular systolic function is normal. The right ventricular size is normal. There is normal pulmonary artery systolic pressure. The estimated right ventricular systolic pressure is 20.1 mmHg. 3. Left atrial size was mildly dilated. 4. Right atrial size was mildly dilated. 5. The mitral valve is normal in structure. No evidence of mitral valve regurgitation. No evidence of mitral stenosis. 6. The aortic valve is tricuspid. Aortic valve regurgitation is not visualized. No aortic stenosis is present. 7. Aortic dilatation noted. There is mild dilatation of the ascending aorta, measuring 40 mm. 8. The inferior vena cava is normal in size with greater than 50% respiratory variability, suggesting right atrial pressure of 3 mmHg.  FINDINGS Left Ventricle: Left ventricular ejection fraction, by estimation, is 50 to 55%. The left ventricle has low normal function. The left ventricle has no regional wall  motion abnormalities. The left ventricular internal cavity size was normal in size. There is no left ventricular hypertrophy. Left ventricular diastolic parameters are consistent with Grade II diastolic dysfunction (pseudonormalization).  Right Ventricle: The right ventricular size is normal. No increase in right ventricular wall thickness. Right ventricular systolic function is normal. There is normal pulmonary artery systolic pressure. The tricuspid regurgitant velocity is 2.07 m/s, and with an assumed right atrial pressure of 3 mmHg, the estimated right ventricular systolic pressure is 20.1 mmHg.  Left Atrium: Left atrial size was mildly dilated.  Right Atrium: Right atrial size was  mildly dilated.  Pericardium: There is no evidence of pericardial effusion.  Mitral Valve: The mitral valve is normal in structure. No evidence of mitral valve regurgitation. No evidence of mitral valve stenosis.  Tricuspid Valve: The tricuspid valve is normal in structure. Tricuspid valve regurgitation is trivial.  Aortic Valve: The aortic valve is tricuspid. Aortic valve regurgitation is not visualized. No aortic stenosis is present.  Pulmonic Valve: The pulmonic valve was normal in structure. Pulmonic valve regurgitation is not visualized.  Aorta: The aortic root is normal in size and structure and aortic dilatation noted. There is mild dilatation of the ascending aorta, measuring 40 mm.  Venous: The inferior vena cava is normal in size with greater than 50% respiratory variability, suggesting right atrial pressure of 3 mmHg.  IAS/Shunts: No atrial level shunt detected by color flow Doppler.   LEFT VENTRICLE PLAX 2D LVIDd:         4.30 cm   Diastology LVIDs:         3.20 cm   LV e' medial:    7.51 cm/s LV PW:         1.30 cm   LV E/e' medial:  11.4 LV IVS:        1.10 cm   LV e' lateral:   9.14 cm/s LVOT diam:     2.10 cm   LV E/e' lateral: 9.4 LV SV:         68 LV SV Index:   29 LVOT Area:     3.46 cm  3D Volume EF: 3D EF:        54 % LV EDV:       174 ml LV ESV:       80 ml LV SV:        94 ml  RIGHT VENTRICLE RV Basal diam:  3.30 cm RV Mid diam:    2.80 cm RV S prime:     13.60 cm/s TAPSE (M-mode): 2.8 cm  LEFT ATRIUM             Index        RIGHT ATRIUM           Index LA diam:        4.10 cm 1.72 cm/m   RA Area:     19.60 cm LA Vol (A2C):   72.3 ml 30.36 ml/m  RA Volume:   55.20 ml  23.18 ml/m LA Vol (A4C):   40.8 ml 17.13 ml/m LA Biplane Vol: 53.3 ml 22.38 ml/m AORTIC VALVE LVOT Vmax:   92.20 cm/s LVOT Vmean:  58.200 cm/s LVOT VTI:    0.196 m  AORTA Ao Root diam: 3.60 cm Ao Asc diam:  4.00 cm  MITRAL VALVE               TRICUSPID VALVE MV Area  (PHT): 4.21 cm    TR Peak grad:  17.1 mmHg MV Decel Time: 180 msec    TR Vmax:        207.00 cm/s MV E velocity: 85.90 cm/s MV A velocity: 61.30 cm/s  SHUNTS MV E/A ratio:  1.40        Systemic VTI:  0.20 m Systemic Diam: 2.10 cm  Dalton McleanMD Electronically signed by Wilfred Lacy Signature Date/Time: 01/29/2023/1:41:31 PM    Final     CT SCANS  CT CORONARY MORPH W/CTA COR W/SCORE 01/11/2023  Addendum 01/11/2023  3:43 PM ADDENDUM REPORT: 01/11/2023 15:41  EXAM: OVER-READ INTERPRETATION  CT CHEST  The following report is an over-read performed by radiologist Dr. Karle Barr [facility] on 01/11/2023. This over-read does not include interpretation of cardiac or coronary anatomy or pathology. The coronary CTA interpretation by the cardiologist is attached.  COMPARISON:  01/29/2019  FINDINGS: Aorta normal caliber without aneurysm or dissection. No pericardial effusion. Pulmonary arteries patent. Esophagus unremarkable. No adenopathy. Visualized upper abdomen normal. Lungs clear. Osseous structures unremarkable.  IMPRESSION: No significant extracardiac findings.   Electronically Signed By: Ulyses Southward M.D. On: 01/11/2023 15:41  Narrative CLINICAL DATA:  Chest pain  EXAM: Cardiac CTA  MEDICATIONS: Sub lingual nitro. 4mg  x 2  TECHNIQUE: The patient was scanned on a Siemens 192 slice scanner. Gantry rotation speed was 250 msecs. Collimation was 0.6 mm. A 100 kV prospective scan was triggered in the ascending thoracic aorta at 35-75% of the R-R interval. Average HR during the scan was 60 bpm. The 3D data set was interpreted on a dedicated work station using MPR, MIP and VRT modes. A total of 80cc of contrast was used.  FINDINGS: Non-cardiac: See separate report from [facility].  No LA appendage thrombus. Pulmonary veins drain normally to the left atrium.  Calcium Score: 54.5 Agatston units.  Coronary Arteries: Left dominant with  no anomalies  LM: Calcified plaque distal left main, minimal stenosis.  LAD system: Calcified plaque proximal LAD, minimal stenosis.  Circumflex system: Large LCx, supplies left-sided PDA. No plaque or stenosis.  RCA system: Nondominant vessel.  No plaque or stenosis.  IMPRESSION: 1. Coronary artery calcium score 54.5 Agatston units. This places the patient in the 69th percentile for age and gender, suggesting intermediate risk for future cardiac events.  2.  Mild nonobstructive coronary disease.  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 On: 01/09/2023 17:08         Recent Labs: 12/28/2022: ALT 12; Hemoglobin 15.2; Platelets 291.0; TSH 3.34 01/03/2023: BUN 13; Creatinine, Ser 1.21; Potassium 4.8; Sodium 141  Recent Lipid Panel    Component Value Date/Time   CHOL 145 12/28/2022 1218   TRIG 64.0 12/28/2022 1218   HDL 42.70 12/28/2022 1218   CHOLHDL 3 12/28/2022 1218   VLDL 12.8 12/28/2022 1218   LDLCALC 89 12/28/2022 1218    History of Present Illness    57 year old male with the above past medical history including elevated coronary artery calcium score, dyspnea on exertion, mild dilation of the ascending aorta, hyperlipidemia, former tobacco use, and GERD.  He was referred to Dr. Allyson Sabal in 12/2022 in the setting of a 3 to 66-month history of exertional dyspnea and substernal chest pressure.  Coronary CTA revealed coronary calcium score 54.5 (69th percentile), mild nonobstructive CAD.  Echocardiogram showed EF 50 to 55%, low normal LV function, no RWMA, G2 DD, normal RV systolic function, no significant valvular abnormalities, mild dilation of the ascending aorta measuring 40 mm.  He was started on aspirin  and Lipitor for ongoing secondary prevention.  He presents today for follow-up.  Since his last visit he has  Elevated coronary artery calcium score/dyspnea on exertion: Dilation of ascending aorta: Hyperlipidemia: Disposition:  Home Medications     Current Outpatient Medications  Medication Sig Dispense Refill   acetaminophen (TYLENOL) 325 MG tablet Take 2 tablets (650 mg total) by mouth every 6 (six) hours as needed for mild pain (or temp > 100). 30 tablet 0   albuterol (VENTOLIN HFA) 108 (90 Base) MCG/ACT inhaler Inhale 2 puffs into the lungs every 6 (six) hours as needed for wheezing or shortness of breath. 8 g 1   aspirin EC 81 MG tablet Take 1 tablet (81 mg total) by mouth daily. Swallow whole.     atorvastatin (LIPITOR) 20 MG tablet Take 1 tablet (20 mg total) by mouth daily. 90 tablet 3   cyanocobalamin (VITAMIN B12) 1000 MCG tablet Take 1,000 mcg by mouth daily.     No current facility-administered medications for this visit.     Review of Systems    ***.  All other systems reviewed and are otherwise negative except as noted above.    Physical Exam    VS:  There were no vitals taken for this visit. , BMI There is no height or weight on file to calculate BMI.     GEN: Well nourished, well developed, in no acute distress. HEENT: normal. Neck: Supple, no JVD, carotid bruits, or masses. Cardiac: RRR, no murmurs, rubs, or gallops. No clubbing, cyanosis, edema.  Radials/DP/PT 2+ and equal bilaterally.  Respiratory:  Respirations regular and unlabored, clear to auscultation bilaterally. GI: Soft, nontender, nondistended, BS + x 4. MS: no deformity or atrophy. Skin: warm and dry, no rash. Neuro:  Strength and sensation are intact. Psych: Normal affect.  Accessory Clinical Findings    ECG personally reviewed by me today - *** - no acute changes.   Lab Results  Component Value Date   WBC 7.6 12/28/2022   HGB 15.2 12/28/2022   HCT 45.0 12/28/2022   MCV 87.9 12/28/2022   PLT 291.0 12/28/2022   Lab Results  Component Value Date   CREATININE 1.21 01/03/2023   BUN 13 01/03/2023   NA 141 01/03/2023   K 4.8 01/03/2023   CL 100 01/03/2023   CO2 24 01/03/2023   Lab Results  Component Value Date   ALT 12 12/28/2022    AST 14 12/28/2022   ALKPHOS 99 12/28/2022   BILITOT 0.5 12/28/2022   Lab Results  Component Value Date   CHOL 145 12/28/2022   HDL 42.70 12/28/2022   LDLCALC 89 12/28/2022   TRIG 64.0 12/28/2022   CHOLHDL 3 12/28/2022    Lab Results  Component Value Date   HGBA1C 5.8 12/28/2022    Assessment & Plan    1.  ***  No BP recorded.  {Refresh Note OR Click here to enter BP  :1}***   Joylene Grapes, NP 05/01/2023, 6:15 AM

## 2023-06-06 ENCOUNTER — Encounter (INDEPENDENT_AMBULATORY_CARE_PROVIDER_SITE_OTHER): Payer: Self-pay | Admitting: Ophthalmology

## 2023-06-06 DIAGNOSIS — H35033 Hypertensive retinopathy, bilateral: Secondary | ICD-10-CM

## 2023-06-06 DIAGNOSIS — H25813 Combined forms of age-related cataract, bilateral: Secondary | ICD-10-CM

## 2023-06-06 DIAGNOSIS — I10 Essential (primary) hypertension: Secondary | ICD-10-CM

## 2023-06-06 DIAGNOSIS — H3562 Retinal hemorrhage, left eye: Secondary | ICD-10-CM

## 2023-08-01 ENCOUNTER — Ambulatory Visit: Payer: Self-pay | Attending: Cardiovascular Disease | Admitting: Cardiovascular Disease

## 2023-08-15 ENCOUNTER — Encounter (INDEPENDENT_AMBULATORY_CARE_PROVIDER_SITE_OTHER): Payer: Self-pay | Admitting: Ophthalmology

## 2023-08-15 DIAGNOSIS — H35033 Hypertensive retinopathy, bilateral: Secondary | ICD-10-CM

## 2023-08-15 DIAGNOSIS — H25813 Combined forms of age-related cataract, bilateral: Secondary | ICD-10-CM

## 2023-08-15 DIAGNOSIS — I10 Essential (primary) hypertension: Secondary | ICD-10-CM

## 2023-08-15 DIAGNOSIS — H3562 Retinal hemorrhage, left eye: Secondary | ICD-10-CM

## 2023-08-17 ENCOUNTER — Encounter (INDEPENDENT_AMBULATORY_CARE_PROVIDER_SITE_OTHER): Payer: Self-pay | Admitting: Ophthalmology

## 2023-08-17 DIAGNOSIS — H25813 Combined forms of age-related cataract, bilateral: Secondary | ICD-10-CM

## 2023-08-17 DIAGNOSIS — H35033 Hypertensive retinopathy, bilateral: Secondary | ICD-10-CM

## 2023-08-17 DIAGNOSIS — I10 Essential (primary) hypertension: Secondary | ICD-10-CM

## 2023-08-17 DIAGNOSIS — H3562 Retinal hemorrhage, left eye: Secondary | ICD-10-CM

## 2023-08-17 NOTE — Progress Notes (Signed)
[facility] - Clinic Note  08/20/2023     CHIEF COMPLAINT Patient presents for Retina Follow Up   HISTORY OF PRESENT ILLNESS: Alan Goodman is a 57 y.o. male who presents to the clinic today for:   HPI     Retina Follow Up   Patient presents with  Other.  In left eye.  This started 11 months ago.  I, the attending physician,  performed the HPI with the patient and updated documentation appropriately.        Comments   Patient here for 6-8 weeks(11 months) retina follow up for ret hem OS. Patient state vision OS not good. Blurred can't hardly see out of OS. OD better. Has a little bit of eye pain OS. Swells up sometimes.       Last edited by Rennis Chris, MD on 08/20/2023  9:38 PM.    Pt is delayed to follow up from 6-8 weeks to 11 months, pt states his vision has been gradually decreasing since the spring, he is also having headaches  Referring physician: Jeani Sow, MD [address] [place],  [place] [address]  HISTORICAL INFORMATION:   Selected notes from the MEDICAL RECORD NUMBER Referred by Dr. Bufford Spikes at [facility] for retinal hemorrhages OS LEE:  Ocular Hx- PMH-    CURRENT MEDICATIONS: No current outpatient medications on file. (Ophthalmic Drugs)   No current facility-administered medications for this visit. (Ophthalmic Drugs)   Current Outpatient Medications (Other)  Medication Sig   acetaminophen (TYLENOL) 325 MG tablet Take 2 tablets (650 mg total) by mouth every 6 (six) hours as needed for mild pain (or temp > 100).   albuterol (VENTOLIN HFA) 108 (90 Base) MCG/ACT inhaler Inhale 2 puffs into the lungs every 6 (six) hours as needed for wheezing or shortness of breath.   aspirin EC 81 MG tablet Take 1 tablet (81 mg total) by mouth daily. Swallow whole.   atorvastatin (LIPITOR) 20 MG tablet Take 1 tablet (20 mg total) by mouth daily.   cyanocobalamin (VITAMIN B12) 1000 MCG tablet Take 1,000 mcg by mouth daily.   No  current facility-administered medications for this visit. (Other)   REVIEW OF SYSTEMS: ROS   Positive for: Eyes Negative for: Constitutional, Gastrointestinal, Neurological, Skin, Genitourinary, Musculoskeletal, HENT, Endocrine, Cardiovascular, Respiratory, Psychiatric, Allergic/Imm, Heme/Lymph Last edited by Laddie Aquas, COA on 08/20/2023  2:08 PM.      ALLERGIES No Known Allergies  PAST MEDICAL HISTORY Past Medical History:  Diagnosis Date   GERD (gastroesophageal reflux disease)    Past Surgical History:  Procedure Laterality Date   CHOLECYSTECTOMY N/A 01/30/2019   Procedure: LAPAROSCOPIC CHOLECYSTECTOMY;  Surgeon: Romie Levee, MD;  Location: [facility];  Service: General;  Laterality: N/A;   INCISION AND DRAINAGE ABSCESS Left 03/31/2016   Procedure: INCISION AND DRAINAGE OF LEFT INNER THIGH ABSCESS;  Surgeon: Chevis Pretty III, MD;  Location: [facility];  Service: General;  Laterality: Left;   NECK SURGERY     plate   FAMILY HISTORY Family History  Problem Relation Age of Onset   Cancer Mother 50 - 15       lung   Heart disease Father 31 - 48   Cancer Maternal Grandfather 67 - 79       colon   Heart disease Maternal Grandfather     SOCIAL HISTORY Social History   Tobacco Use   Smoking status: Former    Current packs/day: 0.00    Average packs/day:  4.0 packs/day for 15.0 years (60.0 ttl pk-yrs)    Types: Cigarettes, E-cigarettes    Start date: 01/29/1998    Quit date: 01/29/2013    Years since quitting: 10.5   Smokeless tobacco: Never  Vaping Use   Vaping status: Every Day  Substance Use Topics   Alcohol use: No   Drug use: No       OPHTHALMIC EXAM:  Base Eye Exam     Visual Acuity (Snellen - Linear)       Right Left   Dist cc 20/20 HM   Dist ph cc  NI    Correction: Glasses         Tonometry (Tonopen, 2:05 PM)       Right Left   Pressure 14 11         Pupils       Dark Light Shape React APD   Right 3 2 Round Brisk None   Left 3 2  Round Brisk None         Visual Fields (Counting fingers)       Left Right     Full   Restrictions Total superior temporal, inferior temporal, superior nasal, inferior nasal deficiencies          Extraocular Movement       Right Left    Full, Ortho Full, Ortho         Neuro/Psych     Oriented x3: Yes   Mood/Affect: Normal         Dilation     Both eyes: 1.0% Mydriacyl, 2.5% Phenylephrine @ 2:05 PM           Slit Lamp and Fundus Exam     Slit Lamp Exam       Right Left   Lids/Lashes Dermatochalasis - upper lid, mild MGD Dermatochalasis - upper lid, mild MGD   Conjunctiva/Sclera Mild temporal pinguecula temporal pinguecula   Cornea 1+ Punctate epithelial erosions arcus, trace PEE   Anterior Chamber deep and clear deep and clear   Iris Round and dilated Round and dilated   Lens 2+ Nuclear sclerosis, 2+ Cortical cataract 2+ Nuclear sclerosis, 2+ Cortical cataract   Anterior Vitreous mild syneresis mild syneresis         Fundus Exam       Right Left   Disc Pink and Sharp +edema, blurred margin, +heme 360   C/D Ratio 0.2 0.4   Macula Flat, Good foveal reflex, mild RPE mottling, No heme or edema Massive central edema, diffuse DBH   Vessels attenuated, mild tortuosity Attenuated arterioles, dilated venules, 360 DBH, 4+tortuosity   Periphery Attached, focal pigmented CR atrophy at 0430, No heme Attached, diffuse DBH           Refraction     Wearing Rx       Sphere Cylinder Axis Add   Right -1.25 +2.75 177 +2.50   Left -2.50 +3.75 175 +2.50         Manifest Refraction       Sphere   Right    Left unable to correct any better            IMAGING AND PROCEDURES  Imaging and Procedures for 08/20/2023  OCT, Retina - OU - Both Eyes       Right Eye Quality was good. Central Foveal Thickness: 302. Progression has been stable. Findings include normal foveal contour, no IRF, no SRF.   Left Eye Quality was good. Central Foveal Thickness:  1204. Progression has worsened. Findings include abnormal foveal contour, intraretinal hyper-reflective material, intraretinal fluid, macular pucker, subretinal fluid (Massive central edema, +vitreous opacities).   Notes *Images captured and stored on drive  Diagnosis / Impression:  OD: NFP, no IRF/SRF OS: Massive central edema, +vitreous opacities  Clinical management:  See below  Abbreviations: NFP - Normal foveal profile. CME - cystoid macular edema. PED - pigment epithelial detachment. IRF - intraretinal fluid. SRF - subretinal fluid. EZ - ellipsoid zone. ERM - epiretinal membrane. ORA - outer retinal atrophy. ORT - outer retinal tubulation. SRHM - subretinal hyper-reflective material. IRHM - intraretinal hyper-reflective material      Fluorescein Angiography Optos (Transit OS)       Right Eye Progression has been stable. Early phase findings include normal observations. Mid/Late phase findings include normal observations, staining (Focal staining inf nasal peripheral midzone; no leakage).   Left Eye Progression has worsened. Early phase findings include delayed filling, vascular perfusion defect (Delayed venous return). Mid/Late phase findings include leakage, vascular perfusion defect (Low fluorescein signal, staining of disc, mild perivascular leakage temporal periphery and proximal venules -- CRVO).   Notes **Images stored on drive**  Impression: OD: normal study; Focal staining inf nasal peripheral midzone; no leakage OS: Low fluorescein signal, staining of disc, mild perivascular leakage temporal periphery and proximal venules -- CRVO      Intravitreal Injection, Pharmacologic Agent - OS - Left Eye       Time Out 08/20/2023. 4:37 PM. Confirmed correct patient, procedure, site, and patient consented.   Anesthesia Topical anesthesia was used. Anesthetic medications included Lidocaine 2%, Proparacaine 0.5%.   Procedure Preparation included 5% betadine to ocular  surface, eyelid speculum. A supplied needle was used.   Injection: 1.25 mg Bevacizumab 1.25mg /0.46ml   Route: Intravitreal, Site: Left Eye   NDC: 16109-604-54, Lot: [id number] , Expiration date: 09/14/2023   Post-op Post injection exam found visual acuity of at least counting fingers. The patient tolerated the procedure well. There were no complications. The patient received written and verbal post procedure care education. Post injection medications were not given.            ASSESSMENT/PLAN:    ICD-10-CM   1. Central retinal vein occlusion with macular edema of left eye  H34.8120 OCT, Retina - OU - Both Eyes    Fluorescein Angiography Optos (Transit OS)    Intravitreal Injection, Pharmacologic Agent - OS - Left Eye    Bevacizumab (AVASTIN) SOLN 1.25 mg    2. Retinal hemorrhage of left eye  H35.62 OCT, Retina - OU - Both Eyes    3. Essential hypertension  I10     4. Hypertensive retinopathy of both eyes  H35.033 Fluorescein Angiography Optos (Transit OS)    5. Combined forms of age-related cataract of both eyes  H25.813      CRVO with retinal hemorrhages OS - pt delayed to follow up from 6-8 weeks to 11 months (10.30.23-09.16.24) - originally referred by [facility] for retinal hemorrhages OS in October 2023 - originally presented to [facility] for updated glasses prescription -- otherwise asymptomatic - FA 10.30.23 shows Vascular nonperfusion temporal periphery; mild perivascular leakage from venules -- mild, remote RVO - today, BCVA OS decreased to HM from 20/40 OS on 10.30.23 - exam shows 360 peripheral DBH, some fine IRH in macula OS + dilated and tortuous venules OS - OCT shows massive central edema, +vitreous opacities - repeat FA (09.16.24) shows staining of disc, mild perivascular leakage temporal periphery and  proximal venules -- CRVO OS - discussed findings, prognosis, and potential treatments - recommend IVA OS #1 today, 09.16.24 - pt wishes to proceed with  injection - RBA of procedure discussed, questions answered - IVA informed consent obtained and signed, 09.16.24 - see procedure note - f/u 4 weeks, DFE, OCT  2,3. Hypertensive retinopathy OU  - BP in office 140-150s / 80-90s at initial visit in 2023  - BP today 130/84 - discussed importance of tight BP control - recommend establishment of PCP and further evaluation and management of BP  4. Mixed Cataract OU - The symptoms of cataract, surgical options, and treatments and risks were discussed with patient. - discussed diagnosis and progression  - monitor for now   Ophthalmic Meds Ordered this visit:  Meds ordered this encounter  Medications   Bevacizumab (AVASTIN) SOLN 1.25 mg     Return in about 4 weeks (around 09/17/2023) for f/u CRVO OS, DFE, OCT.  There are no Patient Instructions on file for this visit.   Explained the diagnoses, plan, and follow up with the patient and they expressed understanding.  Patient expressed understanding of the importance of proper follow up care.   This document serves as a record of services personally performed by Karie Chimera, MD, PhD. It was created on their behalf by Annalee Genta, COMT. The creation of this record is the provider's dictation and/or activities during the visit.  Electronically signed by: Annalee Genta, COMT 08/20/23 9:47 PM  This document serves as a record of services personally performed by Karie Chimera, MD, PhD. It was created on their behalf by Glee Arvin. Manson Passey, OA an ophthalmic technician. The creation of this record is the provider's dictation and/or activities during the visit.    Electronically signed by: Glee Arvin. Manson Passey, OA 08/20/23 9:47 PM  Karie Chimera, M.D., Ph.D. Diseases & Surgery of the Retina and Vitreous [facility]  I have reviewed the above documentation for accuracy and completeness, and I agree with the above. Karie Chimera, M.D., Ph.D. 08/20/23 9:50  PM   Abbreviations: M myopia (nearsighted); A astigmatism; H hyperopia (farsighted); P presbyopia; Mrx spectacle prescription;  CTL contact lenses; OD right eye; OS left eye; OU both eyes  XT exotropia; ET esotropia; PEK punctate epithelial keratitis; PEE punctate epithelial erosions; DES dry eye syndrome; MGD meibomian gland dysfunction; ATs artificial tears; PFAT's preservative free artificial tears; NSC nuclear sclerotic cataract; PSC posterior subcapsular cataract; ERM epi-retinal membrane; PVD posterior vitreous detachment; RD retinal detachment; DM diabetes mellitus; DR diabetic retinopathy; NPDR non-proliferative diabetic retinopathy; PDR proliferative diabetic retinopathy; CSME clinically significant macular edema; DME diabetic macular edema; dbh dot blot hemorrhages; CWS cotton wool spot; POAG primary open angle glaucoma; C/D cup-to-disc ratio; HVF humphrey visual field; GVF goldmann visual field; OCT optical coherence tomography; IOP intraocular pressure; BRVO Branch retinal vein occlusion; CRVO central retinal vein occlusion; CRAO central retinal artery occlusion; BRAO branch retinal artery occlusion; RT retinal tear; SB scleral buckle; PPV pars plana vitrectomy; VH Vitreous hemorrhage; PRP panretinal laser photocoagulation; IVK intravitreal kenalog; VMT vitreomacular traction; MH Macular hole;  NVD neovascularization of the disc; NVE neovascularization elsewhere; AREDS age related eye disease study; ARMD age related macular degeneration; POAG primary open angle glaucoma; EBMD epithelial/anterior basement membrane dystrophy; ACIOL anterior chamber intraocular lens; IOL intraocular lens; PCIOL posterior chamber intraocular lens; Phaco/IOL phacoemulsification with intraocular lens placement; PRK photorefractive keratectomy; LASIK laser assisted in situ keratomileusis; HTN hypertension; DM diabetes mellitus; COPD chronic obstructive pulmonary  disease

## 2023-08-20 ENCOUNTER — Encounter (INDEPENDENT_AMBULATORY_CARE_PROVIDER_SITE_OTHER): Payer: Self-pay | Admitting: Ophthalmology

## 2023-08-20 ENCOUNTER — Ambulatory Visit (INDEPENDENT_AMBULATORY_CARE_PROVIDER_SITE_OTHER): Payer: Self-pay | Admitting: Ophthalmology

## 2023-08-20 VITALS — BP 130/84

## 2023-08-20 DIAGNOSIS — I10 Essential (primary) hypertension: Secondary | ICD-10-CM

## 2023-08-20 DIAGNOSIS — H25813 Combined forms of age-related cataract, bilateral: Secondary | ICD-10-CM

## 2023-08-20 DIAGNOSIS — H34812 Central retinal vein occlusion, left eye, with macular edema: Secondary | ICD-10-CM

## 2023-08-20 DIAGNOSIS — H3562 Retinal hemorrhage, left eye: Secondary | ICD-10-CM

## 2023-08-20 DIAGNOSIS — H35033 Hypertensive retinopathy, bilateral: Secondary | ICD-10-CM

## 2023-08-20 MED ORDER — BEVACIZUMAB CHEMO INJECTION 1.25MG/0.05ML SYRINGE FOR KALEIDOSCOPE
1.2500 mg | INTRAVITREAL | Status: AC | PRN
Start: 2023-08-20 — End: 2023-08-20
  Administered 2023-08-20: 1.25 mg via INTRAVITREAL

## 2023-09-07 ENCOUNTER — Other Ambulatory Visit: Payer: Self-pay | Admitting: Family Medicine

## 2023-09-07 DIAGNOSIS — Z1212 Encounter for screening for malignant neoplasm of rectum: Secondary | ICD-10-CM

## 2023-09-07 DIAGNOSIS — Z1211 Encounter for screening for malignant neoplasm of colon: Secondary | ICD-10-CM

## 2023-09-17 ENCOUNTER — Encounter (INDEPENDENT_AMBULATORY_CARE_PROVIDER_SITE_OTHER): Payer: Medicaid Other | Admitting: Ophthalmology

## 2023-10-05 NOTE — Progress Notes (Signed)
[facility] - Clinic Note  10/08/2023     CHIEF COMPLAINT Patient presents for Retina Follow Up   HISTORY OF PRESENT ILLNESS: Alan Goodman is a 57 y.o. male who presents to the clinic today for:   HPI     Retina Follow Up   Patient presents with  Other.  In left eye.  This started 11 months ago.  I, the attending physician,  performed the HPI with the patient and updated documentation appropriately.        Comments   Patient feels the vision is the same. He is not using eye drops.       Last edited by Rennis Chris, MD on 10/08/2023  6:24 PM.     Referring physician: Jeani Sow, MD [address] [place],  [place] [address]  HISTORICAL INFORMATION:   Selected notes from the MEDICAL RECORD NUMBER Referred by Dr. Bufford Spikes at [facility] for retinal hemorrhages OS LEE:  Ocular Hx- PMH-    CURRENT MEDICATIONS: No current outpatient medications on file. (Ophthalmic Drugs)   No current facility-administered medications for this visit. (Ophthalmic Drugs)   Current Outpatient Medications (Other)  Medication Sig   acetaminophen (TYLENOL) 325 MG tablet Take 2 tablets (650 mg total) by mouth every 6 (six) hours as needed for mild pain (or temp > 100).   albuterol (VENTOLIN HFA) 108 (90 Base) MCG/ACT inhaler Inhale 2 puffs into the lungs every 6 (six) hours as needed for wheezing or shortness of breath.   aspirin EC 81 MG tablet Take 1 tablet (81 mg total) by mouth daily. Swallow whole.   atorvastatin (LIPITOR) 20 MG tablet Take 1 tablet (20 mg total) by mouth daily.   cyanocobalamin (VITAMIN B12) 1000 MCG tablet Take 1,000 mcg by mouth daily.   No current facility-administered medications for this visit. (Other)   REVIEW OF SYSTEMS: ROS   Positive for: Eyes Negative for: Constitutional, Gastrointestinal, Neurological, Skin, Genitourinary, Musculoskeletal, HENT, Endocrine, Cardiovascular, Respiratory, Psychiatric, Allergic/Imm,  Heme/Lymph Last edited by Charlette Caffey, COT on 10/08/2023  1:35 PM.       ALLERGIES No Known Allergies  PAST MEDICAL HISTORY Past Medical History:  Diagnosis Date   GERD (gastroesophageal reflux disease)    Past Surgical History:  Procedure Laterality Date   CHOLECYSTECTOMY N/A 01/30/2019   Procedure: LAPAROSCOPIC CHOLECYSTECTOMY;  Surgeon: Romie Levee, MD;  Location: [facility];  Service: General;  Laterality: N/A;   INCISION AND DRAINAGE ABSCESS Left 03/31/2016   Procedure: INCISION AND DRAINAGE OF LEFT INNER THIGH ABSCESS;  Surgeon: Chevis Pretty III, MD;  Location: [facility];  Service: General;  Laterality: Left;   NECK SURGERY     plate   FAMILY HISTORY Family History  Problem Relation Age of Onset   Cancer Mother 73 - 8       lung   Heart disease Father 16 - 70   Cancer Maternal Grandfather 20 - 79       colon   Heart disease Maternal Grandfather     SOCIAL HISTORY Social History   Tobacco Use   Smoking status: Former    Current packs/day: 0.00    Average packs/day: 4.0 packs/day for 15.0 years (60.0 ttl pk-yrs)    Types: Cigarettes, E-cigarettes    Start date: 01/29/1998    Quit date: 01/29/2013    Years since quitting: 10.6   Smokeless tobacco: Never  Vaping Use   Vaping status: Every Day  Substance Use Topics  Alcohol use: No   Drug use: No       OPHTHALMIC EXAM:  Base Eye Exam     Visual Acuity (Snellen - Linear)       Right Left   Dist cc 20/20 CF at 3'   Dist ph cc  NI         Tonometry (Tonopen, 1:39 PM)       Right Left   Pressure 16 17         Pupils       Dark Light Shape React APD   Right 3 2 Round Brisk None   Left 3 2 Round Brisk None         Visual Fields       Left Right    Full Full         Extraocular Movement       Right Left    Full, Ortho Full, Ortho         Neuro/Psych     Oriented x3: Yes   Mood/Affect: Normal         Dilation     Both eyes: 1.0% Mydriacyl, 2.5% Phenylephrine @ 1:37  PM           Slit Lamp and Fundus Exam     Slit Lamp Exam       Right Left   Lids/Lashes Dermatochalasis - upper lid, mild MGD Dermatochalasis - upper lid, mild MGD   Conjunctiva/Sclera Mild temporal pinguecula temporal pinguecula   Cornea 1+ Punctate epithelial erosions arcus, trace PEE   Anterior Chamber deep and clear deep and clear   Iris Round and dilated Round and dilated   Lens 2+ Nuclear sclerosis, 2+ Cortical cataract 2+ Nuclear sclerosis, 2+ Cortical cataract   Anterior Vitreous mild syneresis mild syneresis, Posterior vitreous detachment         Fundus Exam       Right Left   Disc Pink and Sharp +edema, blurred margin, +heme 360- improving   C/D Ratio 0.2 0.4   Macula Flat, Good foveal reflex, mild RPE mottling, No heme or edema Massive central edema, diffuse DBH- improving, scattered exudates   Vessels attenuated, mild tortuosity Attenuated arterioles, dilated venules, CRVO, 360 DBH--improved, 4+tortuosity   Periphery Attached, focal pigmented CR atrophy at 0430, No heme Attached, diffuse DBH 360- improved           Refraction     Wearing Rx       Sphere Cylinder Axis Add   Right -1.25 +2.75 177 +2.50   Left -2.50 +3.75 175 +2.50            IMAGING AND PROCEDURES  Imaging and Procedures for 10/08/2023  OCT, Retina - OU - Both Eyes       Right Eye Quality was good. Central Foveal Thickness: 295. Progression has been stable. Findings include normal foveal contour, no IRF, no SRF.   Left Eye Quality was good. Central Foveal Thickness: 697. Progression has improved. Findings include abnormal foveal contour, intraretinal hyper-reflective material, intraretinal fluid, macular pucker, subretinal fluid (Interval improvement in massive central edema, +vitreous opacities--improving).   Notes *Images captured and stored on drive  Diagnosis / Impression:  OD: NFP, no IRF/SRF OS: Interval improvement in massive central edema, +vitreous  opacities--improving  Clinical management:  See below  Abbreviations: NFP - Normal foveal profile. CME - cystoid macular edema. PED - pigment epithelial detachment. IRF - intraretinal fluid. SRF - subretinal fluid. EZ - ellipsoid zone. ERM - epiretinal  membrane. ORA - outer retinal atrophy. ORT - outer retinal tubulation. SRHM - subretinal hyper-reflective material. IRHM - intraretinal hyper-reflective material      Intravitreal Injection, Pharmacologic Agent - OS - Left Eye       Time Out 10/08/2023. 2:05 PM. Confirmed correct patient, procedure, site, and patient consented.   Anesthesia Topical anesthesia was used. Anesthetic medications included Lidocaine 2%, Proparacaine 0.5%.   Procedure Preparation included 5% betadine to ocular surface, eyelid speculum. A (32g) needle was used.   Injection: 1.25 mg Bevacizumab 1.25mg /0.72ml   Route: Intravitreal, Site: Left Eye   NDC: [id number], Lot: [id number], Expiration date: 12/29/2023   Post-op Post injection exam found visual acuity of at least counting fingers. The patient tolerated the procedure well. There were no complications. The patient received written and verbal post procedure care education. Post injection medications were not given.            ASSESSMENT/PLAN:    ICD-10-CM   1. Central retinal vein occlusion with macular edema of left eye  H34.8120 OCT, Retina - OU - Both Eyes    Intravitreal Injection, Pharmacologic Agent - OS - Left Eye    Bevacizumab (AVASTIN) SOLN 1.25 mg    2. Essential hypertension  I10     3. Hypertensive retinopathy of both eyes  H35.033     4. Combined forms of age-related cataract of both eyes  H25.813      CRVO with retinal hemorrhages OS  - s/p IVA OS #1 (09.16.24)  - pt delayed to follow up from 4 to 7 weeks (11.04.24) - pt delayed to follow up from 6-8 weeks to 11 months (10.30.23-09.16.24) - originally referred by [facility] for retinal hemorrhages OS in October 2023 -  originally presented to [facility] for updated glasses prescription -- otherwise asymptomatic - FA 10.30.23 shows Vascular nonperfusion temporal periphery; mild perivascular leakage from venules -- mild, remote RVO - repeat FA (09.16.24) shows staining of disc, mild perivascular leakage temporal periphery and proximal venules -- CRVO OS - exam showed 360 peripheral DBH, some fine IRH in macula OS + dilated and tortuous venules OS - today, BCVA OS CF from HM on 09.16.24 from 20/40 OS on 10.30.23 - OCT shows Interval improvement in massive central edema, +vitreous opacities--improving - recommend IVA OS #2 today, 11.04.24 - pt wishes to proceed with injection - RBA of procedure discussed, questions answered - IVA informed consent obtained and signed, 09.16.24 - see procedure note - f/u 4 weeks, DFE, OCT  2,3. Hypertensive retinopathy OU  - BP in office 140-150s / 80-90s at initial visit in 2023  - BP today 130/84 - discussed importance of tight BP control - recommend establishment of PCP and further evaluation and management of BP  4. Mixed Cataract OU - The symptoms of cataract, surgical options, and treatments and risks were discussed with patient. - discussed diagnosis and progression  - monitor for now  Ophthalmic Meds Ordered this visit:  Meds ordered this encounter  Medications   Bevacizumab (AVASTIN) SOLN 1.25 mg     Return in about 4 weeks (around 11/05/2023) for f/u CRVO OS , DFE, OCT, Possible, IVA, OS.  There are no Patient Instructions on file for this visit.   Explained the diagnoses, plan, and follow up with the patient and they expressed understanding.  Patient expressed understanding of the importance of proper follow up care.   This document serves as a record of services personally performed by Karie Chimera, MD,  PhD. It was created on their behalf by Annalee Genta, COMT. The creation of this record is the provider's dictation and/or activities during the  visit.  Electronically signed by: Annalee Genta, COMT 10/08/23 10:58 PM  This document serves as a record of services personally performed by Karie Chimera, MD, PhD. It was created on their behalf by Charlette Caffey, COT an ophthalmic technician. The creation of this record is the provider's dictation and/or activities during the visit.    Electronically signed by:  Charlette Caffey, COT  10/08/23 10:58 PM  Karie Chimera, M.D., Ph.D. Diseases & Surgery of the Retina and Vitreous [facility]  I have reviewed the above documentation for accuracy and completeness, and I agree with the above. Karie Chimera, M.D., Ph.D. 10/08/23 10:58 PM  Abbreviations: M myopia (nearsighted); A astigmatism; H hyperopia (farsighted); P presbyopia; Mrx spectacle prescription;  CTL contact lenses; OD right eye; OS left eye; OU both eyes  XT exotropia; ET esotropia; PEK punctate epithelial keratitis; PEE punctate epithelial erosions; DES dry eye syndrome; MGD meibomian gland dysfunction; ATs artificial tears; PFAT's preservative free artificial tears; NSC nuclear sclerotic cataract; PSC posterior subcapsular cataract; ERM epi-retinal membrane; PVD posterior vitreous detachment; RD retinal detachment; DM diabetes mellitus; DR diabetic retinopathy; NPDR non-proliferative diabetic retinopathy; PDR proliferative diabetic retinopathy; CSME clinically significant macular edema; DME diabetic macular edema; dbh dot blot hemorrhages; CWS cotton wool spot; POAG primary open angle glaucoma; C/D cup-to-disc ratio; HVF humphrey visual field; GVF goldmann visual field; OCT optical coherence tomography; IOP intraocular pressure; BRVO Branch retinal vein occlusion; CRVO central retinal vein occlusion; CRAO central retinal artery occlusion; BRAO branch retinal artery occlusion; RT retinal tear; SB scleral buckle; PPV pars plana vitrectomy; VH Vitreous hemorrhage; PRP panretinal laser photocoagulation; IVK  intravitreal kenalog; VMT vitreomacular traction; MH Macular hole;  NVD neovascularization of the disc; NVE neovascularization elsewhere; AREDS age related eye disease study; ARMD age related macular degeneration; POAG primary open angle glaucoma; EBMD epithelial/anterior basement membrane dystrophy; ACIOL anterior chamber intraocular lens; IOL intraocular lens; PCIOL posterior chamber intraocular lens; Phaco/IOL phacoemulsification with intraocular lens placement; PRK photorefractive keratectomy; LASIK laser assisted in situ keratomileusis; HTN hypertension; DM diabetes mellitus; COPD chronic obstructive pulmonary disease

## 2023-10-08 ENCOUNTER — Encounter (INDEPENDENT_AMBULATORY_CARE_PROVIDER_SITE_OTHER): Payer: Self-pay | Admitting: Ophthalmology

## 2023-10-08 ENCOUNTER — Ambulatory Visit (INDEPENDENT_AMBULATORY_CARE_PROVIDER_SITE_OTHER): Payer: Medicaid Other | Admitting: Ophthalmology

## 2023-10-08 DIAGNOSIS — H25813 Combined forms of age-related cataract, bilateral: Secondary | ICD-10-CM

## 2023-10-08 DIAGNOSIS — I10 Essential (primary) hypertension: Secondary | ICD-10-CM | POA: Diagnosis not present

## 2023-10-08 DIAGNOSIS — H34812 Central retinal vein occlusion, left eye, with macular edema: Secondary | ICD-10-CM

## 2023-10-08 DIAGNOSIS — H35033 Hypertensive retinopathy, bilateral: Secondary | ICD-10-CM | POA: Diagnosis not present

## 2023-10-08 MED ORDER — BEVACIZUMAB CHEMO INJECTION 1.25MG/0.05ML SYRINGE FOR KALEIDOSCOPE
1.2500 mg | INTRAVITREAL | Status: AC | PRN
Start: 2023-10-08 — End: 2023-10-08
  Administered 2023-10-08: 1.25 mg via INTRAVITREAL

## 2023-10-29 NOTE — Progress Notes (Signed)
[facility] - Clinic Note  11/05/2023     CHIEF COMPLAINT Patient presents for Retina Follow Up   HISTORY OF PRESENT ILLNESS: Alan Goodman is a 57 y.o. male who presents to the clinic today for:   HPI     Retina Follow Up   Patient presents with  Other.  In left eye.  This started months ago.  Duration of 4 weeks.  I, the attending physician,  performed the HPI with the patient and updated documentation appropriately.        Comments   Patient feels the vision may be a little better. He is not using eye drops.       Last edited by Rennis Chris, MD on 11/05/2023  1:11 PM.      Referring physician: Jeani Sow, MD [address] [place],  [place] [address]  HISTORICAL INFORMATION:   Selected notes from the MEDICAL RECORD NUMBER Referred by Dr. Bufford Spikes at [facility] for retinal hemorrhages OS LEE:  Ocular Hx- PMH-    CURRENT MEDICATIONS: No current outpatient medications on file. (Ophthalmic Drugs)   No current facility-administered medications for this visit. (Ophthalmic Drugs)   Current Outpatient Medications (Other)  Medication Sig   acetaminophen (TYLENOL) 325 MG tablet Take 2 tablets (650 mg total) by mouth every 6 (six) hours as needed for mild pain (or temp > 100).   albuterol (VENTOLIN HFA) 108 (90 Base) MCG/ACT inhaler Inhale 2 puffs into the lungs every 6 (six) hours as needed for wheezing or shortness of breath.   aspirin EC 81 MG tablet Take 1 tablet (81 mg total) by mouth daily. Swallow whole.   atorvastatin (LIPITOR) 20 MG tablet Take 1 tablet (20 mg total) by mouth daily.   cyanocobalamin (VITAMIN B12) 1000 MCG tablet Take 1,000 mcg by mouth daily.   No current facility-administered medications for this visit. (Other)   REVIEW OF SYSTEMS: ROS   Positive for: Eyes Negative for: Constitutional, Gastrointestinal, Neurological, Skin, Genitourinary, Musculoskeletal, HENT, Endocrine, Cardiovascular, Respiratory,  Psychiatric, Allergic/Imm, Heme/Lymph Last edited by Charlette Caffey, COT on 11/05/2023 12:27 PM.     ALLERGIES No Known Allergies  PAST MEDICAL HISTORY Past Medical History:  Diagnosis Date   GERD (gastroesophageal reflux disease)    Past Surgical History:  Procedure Laterality Date   CHOLECYSTECTOMY N/A 01/30/2019   Procedure: LAPAROSCOPIC CHOLECYSTECTOMY;  Surgeon: Romie Levee, MD;  Location: [facility];  Service: General;  Laterality: N/A;   INCISION AND DRAINAGE ABSCESS Left 03/31/2016   Procedure: INCISION AND DRAINAGE OF LEFT INNER THIGH ABSCESS;  Surgeon: Chevis Pretty III, MD;  Location: [facility];  Service: General;  Laterality: Left;   NECK SURGERY     plate   FAMILY HISTORY Family History  Problem Relation Age of Onset   Cancer Mother 48 - 48       lung   Heart disease Father 67 - 62   Cancer Maternal Grandfather 96 - 79       colon   Heart disease Maternal Grandfather     SOCIAL HISTORY Social History   Tobacco Use   Smoking status: Former    Current packs/day: 0.00    Average packs/day: 4.0 packs/day for 15.0 years (60.0 ttl pk-yrs)    Types: Cigarettes, E-cigarettes    Start date: 01/29/1998    Quit date: 01/29/2013    Years since quitting: 10.7   Smokeless tobacco: Never  Vaping Use   Vaping status: Every Day  Substance  Use Topics   Alcohol use: No   Drug use: No       OPHTHALMIC EXAM:  Base Eye Exam     Visual Acuity (Snellen - Linear)       Right Left   Dist cc 20/20 20/150 +1   Dist ph cc  NI    Correction: Glasses         Tonometry (Tonopen, 12:31 PM)       Right Left   Pressure 17 18         Pupils       Dark Light Shape React APD   Right 3 2 Round Brisk None   Left 3 2 Round Brisk None         Visual Fields       Left Right    Full Full         Extraocular Movement       Right Left    Full, Ortho Full, Ortho         Neuro/Psych     Oriented x3: Yes   Mood/Affect: Normal         Dilation      Both eyes: 1.0% Mydriacyl, 2.5% Phenylephrine @ 12:27 PM           Slit Lamp and Fundus Exam     Slit Lamp Exam       Right Left   Lids/Lashes Dermatochalasis - upper lid, mild MGD Dermatochalasis - upper lid, mild MGD   Conjunctiva/Sclera Mild temporal pinguecula temporal pinguecula   Cornea 1+ Punctate epithelial erosions arcus, trace PEE   Anterior Chamber deep and clear deep and clear   Iris Round and dilated Round and dilated   Lens 2+ Nuclear sclerosis, 2+ Cortical cataract 2+ Nuclear sclerosis, 2+ Cortical cataract   Anterior Vitreous mild syneresis mild syneresis, Posterior vitreous detachment         Fundus Exam       Right Left   Disc Pink and Sharp +edema, blurred margin, +heme 360 -- improved   C/D Ratio 0.2 0.2   Macula Flat, Good foveal reflex, mild RPE mottling, No heme or edema central edema -- improved, diffuse DBH -- improving, scattered exudates -- improving   Vessels attenuated, mild tortuosity Attenuated arterioles, dilated venules, CRVO, 360 DBH -- improved   Periphery Attached, focal pigmented CR atrophy at 0430, No heme Attached, scattered DBH greatest temporal periphery           Refraction     Wearing Rx       Sphere Cylinder Axis Add   Right -1.25 +2.75 177 +2.50   Left -2.50 +3.75 175 +2.50            IMAGING AND PROCEDURES  Imaging and Procedures for 11/05/2023  OCT, Retina - OU - Both Eyes       Right Eye Quality was good. Central Foveal Thickness: 295. Progression has been stable. Findings include normal foveal contour, no IRF, no SRF.   Left Eye Quality was good. Central Foveal Thickness: 596. Progression has improved. Findings include no SRF, abnormal foveal contour, intraretinal hyper-reflective material, intraretinal fluid, macular pucker (Interval improvement in diffuse edema, residual IRF centrally, +vitreous opacities -- improving).   Notes *Images captured and stored on drive  Diagnosis / Impression:  OD: NFP, no  IRF/SRF OS: Interval improvement in diffuse edema, residual IRF centrally, +vitreous opacities -- improving  Clinical management:  See below  Abbreviations: NFP - Normal foveal profile. CME -  cystoid macular edema. PED - pigment epithelial detachment. IRF - intraretinal fluid. SRF - subretinal fluid. EZ - ellipsoid zone. ERM - epiretinal membrane. ORA - outer retinal atrophy. ORT - outer retinal tubulation. SRHM - subretinal hyper-reflective material. IRHM - intraretinal hyper-reflective material      Intravitreal Injection, Pharmacologic Agent - OS - Left Eye       Time Out 11/05/2023. 12:51 PM. Confirmed correct patient, procedure, site, and patient consented.   Anesthesia Topical anesthesia was used. Anesthetic medications included Lidocaine 2%, Proparacaine 0.5%.   Procedure Preparation included 5% betadine to ocular surface, eyelid speculum. A supplied (32g) needle was used.   Injection: 1.25 mg Bevacizumab 1.25mg /0.32ml   Route: Intravitreal, Site: Left Eye   NDC: [id number], Lot: [id number], Expiration date: 12/10/2023   Post-op Post injection exam found visual acuity of at least counting fingers. The patient tolerated the procedure well. There were no complications. The patient received written and verbal post procedure care education. Post injection medications were not given.            ASSESSMENT/PLAN:    ICD-10-CM   1. Central retinal vein occlusion with macular edema of left eye  H34.8120 OCT, Retina - OU - Both Eyes    Intravitreal Injection, Pharmacologic Agent - OS - Left Eye    Bevacizumab (AVASTIN) SOLN 1.25 mg    2. Essential hypertension  I10     3. Hypertensive retinopathy of both eyes  H35.033     4. Combined forms of age-related cataract of both eyes  H25.813     5. Retinal hemorrhage of left eye  H35.62      CRVO with retinal hemorrhages OS  - s/p IVA OS #1 (09.16.24), #2 (11.04.24)  - pt delayed to follow up from 4 to 7 weeks (11.04.24) - pt  delayed to follow up from 6-8 weeks to 11 months (10.30.23-09.16.24) - originally referred by [facility] for retinal hemorrhages OS in October 2023 - originally presented to [facility] for updated glasses prescription -- otherwise asymptomatic - FA 10.30.23 shows Vascular nonperfusion temporal periphery; mild perivascular leakage from venules -- mild, remote RVO - repeat FA (09.16.24) shows staining of disc, mild perivascular leakage temporal periphery and proximal venules -- CRVO OS - exam showed 360 peripheral DBH, some fine IRH in macula OS + dilated and tortuous venules OS - today, BCVA OS 20/150 from CF - OCT shows interval improvement in diffuse edema, residual IRF centrally, +vitreous opacities -- improving - recommend IVA OS #3 today, 12.02.24 - pt wishes to proceed with injection - RBA of procedure discussed, questions answered - IVA informed consent obtained and signed, 09.16.24 - see procedure note - f/u 4 weeks, DFE, OCT  2,3. Hypertensive retinopathy OU  - BP in office 140-150s / 80-90s at initial visit in 2023 - discussed importance of tight BP control - recommend establishment of PCP and further evaluation and management of BP  4. Mixed Cataract OU - The symptoms of cataract, surgical options, and treatments and risks were discussed with patient. - discussed diagnosis and progression  - monitor for now  Ophthalmic Meds Ordered this visit:  Meds ordered this encounter  Medications   Bevacizumab (AVASTIN) SOLN 1.25 mg     Return in about 4 weeks (around 12/03/2023) for f/u CRVO OS, DFE, OCT.  There are no Patient Instructions on file for this visit.  Explained the diagnoses, plan, and follow up with the patient and they expressed understanding.  Patient expressed understanding  of the importance of proper follow up care.   This document serves as a record of services personally performed by Karie Chimera, MD, PhD. It was created on their behalf by Glee Arvin. Manson Passey,  OA an ophthalmic technician. The creation of this record is the provider's dictation and/or activities during the visit.    Electronically signed by: Glee Arvin. Manson Passey, OA 11/05/23 10:21 PM  Karie Chimera, M.D., Ph.D. Diseases & Surgery of the Retina and Vitreous [facility]  I have reviewed the above documentation for accuracy and completeness, and I agree with the above. Karie Chimera, M.D., Ph.D. 11/05/23 10:22 PM   Abbreviations: M myopia (nearsighted); A astigmatism; H hyperopia (farsighted); P presbyopia; Mrx spectacle prescription;  CTL contact lenses; OD right eye; OS left eye; OU both eyes  XT exotropia; ET esotropia; PEK punctate epithelial keratitis; PEE punctate epithelial erosions; DES dry eye syndrome; MGD meibomian gland dysfunction; ATs artificial tears; PFAT's preservative free artificial tears; NSC nuclear sclerotic cataract; PSC posterior subcapsular cataract; ERM epi-retinal membrane; PVD posterior vitreous detachment; RD retinal detachment; DM diabetes mellitus; DR diabetic retinopathy; NPDR non-proliferative diabetic retinopathy; PDR proliferative diabetic retinopathy; CSME clinically significant macular edema; DME diabetic macular edema; dbh dot blot hemorrhages; CWS cotton wool spot; POAG primary open angle glaucoma; C/D cup-to-disc ratio; HVF humphrey visual field; GVF goldmann visual field; OCT optical coherence tomography; IOP intraocular pressure; BRVO Branch retinal vein occlusion; CRVO central retinal vein occlusion; CRAO central retinal artery occlusion; BRAO branch retinal artery occlusion; RT retinal tear; SB scleral buckle; PPV pars plana vitrectomy; VH Vitreous hemorrhage; PRP panretinal laser photocoagulation; IVK intravitreal kenalog; VMT vitreomacular traction; MH Macular hole;  NVD neovascularization of the disc; NVE neovascularization elsewhere; AREDS age related eye disease study; ARMD age related macular degeneration; POAG primary open  angle glaucoma; EBMD epithelial/anterior basement membrane dystrophy; ACIOL anterior chamber intraocular lens; IOL intraocular lens; PCIOL posterior chamber intraocular lens; Phaco/IOL phacoemulsification with intraocular lens placement; PRK photorefractive keratectomy; LASIK laser assisted in situ keratomileusis; HTN hypertension; DM diabetes mellitus; COPD chronic obstructive pulmonary disease

## 2023-11-05 ENCOUNTER — Ambulatory Visit (INDEPENDENT_AMBULATORY_CARE_PROVIDER_SITE_OTHER): Payer: Medicaid Other | Admitting: Ophthalmology

## 2023-11-05 ENCOUNTER — Encounter (INDEPENDENT_AMBULATORY_CARE_PROVIDER_SITE_OTHER): Payer: Self-pay | Admitting: Ophthalmology

## 2023-11-05 DIAGNOSIS — H25813 Combined forms of age-related cataract, bilateral: Secondary | ICD-10-CM | POA: Diagnosis not present

## 2023-11-05 DIAGNOSIS — H34812 Central retinal vein occlusion, left eye, with macular edema: Secondary | ICD-10-CM

## 2023-11-05 DIAGNOSIS — H35033 Hypertensive retinopathy, bilateral: Secondary | ICD-10-CM

## 2023-11-05 DIAGNOSIS — H3562 Retinal hemorrhage, left eye: Secondary | ICD-10-CM | POA: Diagnosis not present

## 2023-11-05 DIAGNOSIS — I10 Essential (primary) hypertension: Secondary | ICD-10-CM | POA: Diagnosis not present

## 2023-11-05 MED ORDER — BEVACIZUMAB CHEMO INJECTION 1.25MG/0.05ML SYRINGE FOR KALEIDOSCOPE
1.2500 mg | INTRAVITREAL | Status: AC | PRN
Start: 2023-11-05 — End: 2023-11-05
  Administered 2023-11-05: 1.25 mg via INTRAVITREAL

## 2023-11-22 NOTE — Progress Notes (Signed)
 [facility] - Clinic Note  12/04/2023     CHIEF COMPLAINT Patient presents for Retina Follow Up   HISTORY OF PRESENT ILLNESS: Alan Goodman is a 57 y.o. male who presents to the clinic today for:   HPI     Retina Follow Up   Patient presents with  CRVO/BRVO.  In left eye.  This started 4 weeks ago.  I, the attending physician,  performed the HPI with the patient and updated documentation appropriately.        Comments   Patient here for 4 weeks retina follow up for CRVO OS. Patient states vision a little bit better OS. Having trouble with OD. Having the same thing that OS having. OD a little blurry. Has eye pain sometimes. Has FB sensation in OS.      Last edited by Valdemar Rogue, MD on 12/04/2023  3:46 PM.    Patient states the vision is about the same. He is complaining of a foreign body in the right eye.   Referring physician: Wendolyn Jenkins Jansky, MD [address] [place],  [place] [address]  HISTORICAL INFORMATION:   Selected notes from the MEDICAL RECORD NUMBER Referred by Dr. Lawence at [facility] for retinal hemorrhages OS LEE:  Ocular Hx- PMH-    CURRENT MEDICATIONS: Current Outpatient Medications (Ophthalmic Drugs)  Medication Sig   ofloxacin  (OCUFLOX ) 0.3 % ophthalmic solution Place 1 drop into the right eye 4 (four) times daily for 14 days.   No current facility-administered medications for this visit. (Ophthalmic Drugs)   Current Outpatient Medications (Other)  Medication Sig   acetaminophen  (TYLENOL ) 325 MG tablet Take 2 tablets (650 mg total) by mouth every 6 (six) hours as needed for mild pain (or temp > 100).   albuterol  (VENTOLIN  HFA) 108 (90 Base) MCG/ACT inhaler Inhale 2 puffs into the lungs every 6 (six) hours as needed for wheezing or shortness of breath.   aspirin  EC 81 MG tablet Take 1 tablet (81 mg total) by mouth daily. Swallow whole.   atorvastatin  (LIPITOR) 20 MG tablet Take 1 tablet (20 mg total) by  mouth daily.   cyanocobalamin  (VITAMIN B12) 1000 MCG tablet Take 1,000 mcg by mouth daily.   No current facility-administered medications for this visit. (Other)   REVIEW OF SYSTEMS: ROS   Positive for: Eyes Negative for: Constitutional, Gastrointestinal, Neurological, Skin, Genitourinary, Musculoskeletal, HENT, Endocrine, Cardiovascular, Respiratory, Psychiatric, Allergic/Imm, Heme/Lymph Last edited by Orval Asberry RAMAN, COA on 12/04/2023  1:06 PM.     ALLERGIES No Known Allergies  PAST MEDICAL HISTORY Past Medical History:  Diagnosis Date   GERD (gastroesophageal reflux disease)    Past Surgical History:  Procedure Laterality Date   CHOLECYSTECTOMY N/A 01/30/2019   Procedure: LAPAROSCOPIC CHOLECYSTECTOMY;  Surgeon: Debby Hila, MD;  Location: [facility];  Service: General;  Laterality: N/A;   INCISION AND DRAINAGE ABSCESS Left 03/31/2016   Procedure: INCISION AND DRAINAGE OF LEFT INNER THIGH ABSCESS;  Surgeon: Deward Null III, MD;  Location: [facility];  Service: General;  Laterality: Left;   NECK SURGERY     plate   FAMILY HISTORY Family History  Problem Relation Age of Onset   Cancer Mother 70 - 14       lung   Heart disease Father 29 - 36   Cancer Maternal Grandfather 5 - 79       colon   Heart disease Maternal Grandfather     SOCIAL HISTORY Social History   Tobacco Use  Smoking status: Former    Current packs/day: 0.00    Average packs/day: 4.0 packs/day for 15.0 years (60.0 ttl pk-yrs)    Types: Cigarettes, E-cigarettes    Start date: 01/29/1998    Quit date: 01/29/2013    Years since quitting: 10.8   Smokeless tobacco: Never  Vaping Use   Vaping status: Every Day  Substance Use Topics   Alcohol use: No   Drug use: No       OPHTHALMIC EXAM:  Base Eye Exam     Visual Acuity (Snellen - Linear)       Right Left   Dist cc 20/25 20/150 -2   Dist ph cc NI NI         Tonometry (Tonopen, 1:03 PM)       Right Left   Pressure 20 15         Pupils        Dark Light Shape React APD   Right 3 2 Round Brisk None   Left 3 2 Round Brisk None         Visual Fields (Counting fingers)       Left Right    Full Full         Extraocular Movement       Right Left    Full, Ortho Full, Ortho         Neuro/Psych     Oriented x3: Yes   Mood/Affect: Normal         Dilation     Both eyes: 1.0% Mydriacyl, 2.5% Phenylephrine @ 1:02 PM           Slit Lamp and Fundus Exam     Slit Lamp Exam       Right Left   Lids/Lashes Dermatochalasis - upper lid, mild MGD Dermatochalasis - upper lid, mild MGD   Conjunctiva/Sclera Mild temporal pinguecula, + mucus temporal pinguecula   Cornea 1+ Punctate epithelial erosions, Debris in tear film, healed epi defect 0330 mid zone, punctate staining,  arcus, trace PEE   Anterior Chamber deep and clear deep and clear   Iris Round and dilated Round and dilated   Lens 2+ Nuclear sclerosis, 2+ Cortical cataract 2+ Nuclear sclerosis, 2+ Cortical cataract   Anterior Vitreous mild syneresis mild syneresis, Posterior vitreous detachment         Fundus Exam       Right Left   Disc Pink and Sharp +edema, blurred margin, +heme 360 -- improved   C/D Ratio 0.2 0.2   Macula Flat, Good foveal reflex, mild RPE mottling, No heme or edema central edema, diffuse DBH -- improving, scattered exudates -- improving   Vessels attenuated, mild tortuosity Attenuated arterioles, dilated venules, CRVO, 360 DBH -- improved   Periphery Attached, focal pigmented CR atrophy at 0430, No heme Attached, scattered DBH greatest temporal periphery           Refraction     Wearing Rx       Sphere Cylinder Axis Add   Right -1.25 +2.75 177 +2.50   Left -2.50 +3.75 175 +2.50            IMAGING AND PROCEDURES  Imaging and Procedures for 12/04/2023  OCT, Retina - OU - Both Eyes       Right Eye Quality was good. Central Foveal Thickness: 295. Progression has been stable. Findings include normal foveal  contour, no IRF, no SRF.   Left Eye Quality was good. Central Foveal Thickness: 532. Progression has  improved. Findings include no SRF, abnormal foveal contour, intraretinal hyper-reflective material, intraretinal fluid, macular pucker (stable improvement in diffuse edema, residual IRF centrally--slightly improved, +vitreous opacities -- improving).   Notes *Images captured and stored on drive  Diagnosis / Impression:  OD: NFP; no IRF/SRF  OS: stable improvement in diffuse edema, residual IRF centrally--slightly improved, +vitreous opacities -- improving  Clinical management:  See below  Abbreviations: NFP - Normal foveal profile. CME - cystoid macular edema. PED - pigment epithelial detachment. IRF - intraretinal fluid. SRF - subretinal fluid. EZ - ellipsoid zone. ERM - epiretinal membrane. ORA - outer retinal atrophy. ORT - outer retinal tubulation. SRHM - subretinal hyper-reflective material. IRHM - intraretinal hyper-reflective material      Intravitreal Injection, Pharmacologic Agent - OS - Left Eye       Time Out 12/04/2023. 2:03 PM. Confirmed correct patient, procedure, site, and patient consented.   Anesthesia Topical anesthesia was used. Anesthetic medications included Lidocaine  2%, Proparacaine 0.5%.   Procedure Preparation included 5% betadine to ocular surface, eyelid speculum. A (32g) needle was used.   Injection: 1.25 mg Bevacizumab  1.25mg /0.52ml   Route: Intravitreal, Site: Left Eye   NDC: [id number], Lot: [id number], Expiration date: 02/18/2024   Post-op Post injection exam found visual acuity of at least counting fingers. The patient tolerated the procedure well. There were no complications. The patient received written and verbal post procedure care education. Post injection medications were not given.            ASSESSMENT/PLAN:    ICD-10-CM   1. Central retinal vein occlusion with macular edema of left eye  H34.8120 OCT, Retina - OU - Both Eyes     Intravitreal Injection, Pharmacologic Agent - OS - Left Eye    Bevacizumab  (AVASTIN ) SOLN 1.25 mg    2. Essential hypertension  I10     3. Hypertensive retinopathy of both eyes  H35.033     4. Combined forms of age-related cataract of both eyes  H25.813     5. Abrasion of right cornea, initial encounter  S05.01XA      CRVO with retinal hemorrhages OS  - s/p IVA OS #1 (09.16.24), #2 (11.04.24), #3 (12.02.24)  - pt delayed to follow up from 4 to 7 weeks (11.04.24) - pt delayed to follow up from 6-8 weeks to 11 months (10.30.23-09.16.24) - originally referred by [facility] for retinal hemorrhages OS in October 2023 - originally presented to [facility] for updated glasses prescription -- otherwise asymptomatic - FA 10.30.23 shows Vascular nonperfusion temporal periphery; mild perivascular leakage from venules -- mild, remote RVO - repeat FA (09.16.24) shows staining of disc, mild perivascular leakage temporal periphery and proximal venules -- CRVO OS - exam showed 360 peripheral DBH, some fine IRH in macula OS + dilated and tortuous venules OS - today, BCVA OS 20/150 - stable - OCT shows stable improvement in diffuse edema, residual IRF centrally--slightly improved, +vitreous opacities -- improving at 4 wks - recommend IVA OS #4 today, 12.31.24 w/ f/u in 4 wks - pt wishes to proceed with injection - RBA of procedure discussed, questions answered - IVA informed consent obtained and signed, 09.16.24 - see procedure note - f/u 4 weeks, DFE, OCT, possible injxn  2,3. Hypertensive retinopathy OU  - BP in office 140-150s / 80-90s at initial visit in 2023 - discussed importance of tight BP control - recommend establishment of PCP and further evaluation and management of BP  4. Mixed Cataract OU - The symptoms  of cataract, surgical options, and treatments and risks were discussed with patient. - discussed diagnosis and progression  - monitor for now  5. Corneal abrasion, OD  - pt reports  2-3 day history of FBS and eye rubbing  - punctate foci of fluorescein  staining nasal quadrant OD  - begin Ofloxacin  OD QID for 2 weeks  - f/u in 4 wks, sooner prn  Ophthalmic Meds Ordered this visit:  Meds ordered this encounter  Medications   ofloxacin  (OCUFLOX ) 0.3 % ophthalmic solution    Sig: Place 1 drop into the right eye 4 (four) times daily for 14 days.    Dispense:  2.8 mL    Refill:  0   Bevacizumab  (AVASTIN ) SOLN 1.25 mg     Return in about 4 weeks (around 01/01/2024) for f/u CRVO OS, DFE, OCT, Possible, IVA, OS.  There are no Patient Instructions on file for this visit.  Explained the diagnoses, plan, and follow up with the patient and they expressed understanding.  Patient expressed understanding of the importance of proper follow up care.  This document serves as a record of services personally performed by Redell JUDITHANN Hans, MD, PhD. It was created on their behalf by Wanda GEANNIE Keens, COT an ophthalmic technician. The creation of this record is the provider's dictation and/or activities during the visit.    Electronically signed by:  Wanda GEANNIE Keens, COT  12/04/23 3:51 PM  Redell JUDITHANN Hans, M.D., Ph.D. Diseases & Surgery of the Retina and Vitreous [facility]  I have reviewed the above documentation for accuracy and completeness, and I agree with the above. Redell JUDITHANN Hans, M.D., Ph.D. 12/04/23 4:10 PM   Abbreviations: M myopia (nearsighted); A astigmatism; H hyperopia (farsighted); P presbyopia; Mrx spectacle prescription;  CTL contact lenses; OD right eye; OS left eye; OU both eyes  XT exotropia; ET esotropia; PEK punctate epithelial keratitis; PEE punctate epithelial erosions; DES dry eye syndrome; MGD meibomian gland dysfunction; ATs artificial tears; PFAT's preservative free artificial tears; NSC nuclear sclerotic cataract; PSC posterior subcapsular cataract; ERM epi-retinal membrane; PVD posterior vitreous detachment; RD retinal  detachment; DM diabetes mellitus; DR diabetic retinopathy; NPDR non-proliferative diabetic retinopathy; PDR proliferative diabetic retinopathy; CSME clinically significant macular edema; DME diabetic macular edema; dbh dot blot hemorrhages; CWS cotton wool spot; POAG primary open angle glaucoma; C/D cup-to-disc ratio; HVF humphrey visual field; GVF goldmann visual field; OCT optical coherence tomography; IOP intraocular pressure; BRVO Branch retinal vein occlusion; CRVO central retinal vein occlusion; CRAO central retinal artery occlusion; BRAO branch retinal artery occlusion; RT retinal tear; SB scleral buckle; PPV pars plana vitrectomy; VH Vitreous hemorrhage; PRP panretinal laser photocoagulation; IVK intravitreal kenalog; VMT vitreomacular traction; MH Macular hole;  NVD neovascularization of the disc; NVE neovascularization elsewhere; AREDS age related eye disease study; ARMD age related macular degeneration; POAG primary open angle glaucoma; EBMD epithelial/anterior basement membrane dystrophy; ACIOL anterior chamber intraocular lens; IOL intraocular lens; PCIOL posterior chamber intraocular lens; Phaco/IOL phacoemulsification with intraocular lens placement; PRK photorefractive keratectomy; LASIK laser assisted in situ keratomileusis; HTN hypertension; DM diabetes mellitus; COPD chronic obstructive pulmonary disease

## 2023-12-04 ENCOUNTER — Ambulatory Visit (INDEPENDENT_AMBULATORY_CARE_PROVIDER_SITE_OTHER): Payer: Medicaid Other | Admitting: Ophthalmology

## 2023-12-04 ENCOUNTER — Encounter (INDEPENDENT_AMBULATORY_CARE_PROVIDER_SITE_OTHER): Payer: Self-pay | Admitting: Ophthalmology

## 2023-12-04 DIAGNOSIS — H25813 Combined forms of age-related cataract, bilateral: Secondary | ICD-10-CM

## 2023-12-04 DIAGNOSIS — H1830 Unspecified corneal membrane change: Secondary | ICD-10-CM

## 2023-12-04 DIAGNOSIS — H35033 Hypertensive retinopathy, bilateral: Secondary | ICD-10-CM

## 2023-12-04 DIAGNOSIS — H34812 Central retinal vein occlusion, left eye, with macular edema: Secondary | ICD-10-CM | POA: Diagnosis not present

## 2023-12-04 DIAGNOSIS — I10 Essential (primary) hypertension: Secondary | ICD-10-CM | POA: Diagnosis not present

## 2023-12-04 MED ORDER — BEVACIZUMAB CHEMO INJECTION 1.25MG/0.05ML SYRINGE FOR KALEIDOSCOPE
1.2500 mg | INTRAVITREAL | Status: AC | PRN
  Administered 2023-12-04: 1.25 mg via INTRAVITREAL

## 2023-12-04 MED ORDER — OFLOXACIN 0.3 % OP SOLN: 1.0000 [drp] | Freq: Four times a day (QID) | OPHTHALMIC | 0 refills | Status: DC

## 2023-12-11 ENCOUNTER — Other Ambulatory Visit (INDEPENDENT_AMBULATORY_CARE_PROVIDER_SITE_OTHER): Payer: Self-pay

## 2023-12-11 MED ORDER — OFLOXACIN 0.3 % OP SOLN: 1.0000 [drp] | Freq: Four times a day (QID) | OPHTHALMIC | 0 refills | Status: AC

## 2023-12-20 NOTE — Progress Notes (Shared)
[facility] - Clinic Note  01/01/2024     CHIEF COMPLAINT Patient presents for No chief complaint on file.   HISTORY OF PRESENT ILLNESS: Alan Goodman is a 58 y.o. male who presents to the clinic today for:    Patient states the vision is about the same. He is complaining of a foreign body in the right eye.   Referring physician: Jeani Sow, MD [address] [place],  [place] [address]  HISTORICAL INFORMATION:   Selected notes from the MEDICAL RECORD NUMBER Referred by Dr. Bufford Spikes at [facility] for retinal hemorrhages OS LEE:  Ocular Hx- PMH-    CURRENT MEDICATIONS: Current Outpatient Medications (Ophthalmic Drugs)  Medication Sig   ofloxacin (OCUFLOX) 0.3 % ophthalmic solution Place 1 drop into the right eye 4 (four) times daily for 14 days.   No current facility-administered medications for this visit. (Ophthalmic Drugs)   Current Outpatient Medications (Other)  Medication Sig   acetaminophen (TYLENOL) 325 MG tablet Take 2 tablets (650 mg total) by mouth every 6 (six) hours as needed for mild pain (or temp > 100).   albuterol (VENTOLIN HFA) 108 (90 Base) MCG/ACT inhaler Inhale 2 puffs into the lungs every 6 (six) hours as needed for wheezing or shortness of breath.   aspirin EC 81 MG tablet Take 1 tablet (81 mg total) by mouth daily. Swallow whole.   atorvastatin (LIPITOR) 20 MG tablet Take 1 tablet (20 mg total) by mouth daily.   cyanocobalamin (VITAMIN B12) 1000 MCG tablet Take 1,000 mcg by mouth daily.   No current facility-administered medications for this visit. (Other)   REVIEW OF SYSTEMS:   ALLERGIES No Known Allergies  PAST MEDICAL HISTORY Past Medical History:  Diagnosis Date   GERD (gastroesophageal reflux disease)    Past Surgical History:  Procedure Laterality Date   CHOLECYSTECTOMY N/A 01/30/2019   Procedure: LAPAROSCOPIC CHOLECYSTECTOMY;  Surgeon: Romie Levee, MD;  Location: [facility];  Service:  General;  Laterality: N/A;   INCISION AND DRAINAGE ABSCESS Left 03/31/2016   Procedure: INCISION AND DRAINAGE OF LEFT INNER THIGH ABSCESS;  Surgeon: Chevis Pretty III, MD;  Location: [facility];  Service: General;  Laterality: Left;   NECK SURGERY     plate   FAMILY HISTORY Family History  Problem Relation Age of Onset   Cancer Mother 42 - 24       lung   Heart disease Father 61 - 51   Cancer Maternal Grandfather 92 - 79       colon   Heart disease Maternal Grandfather     SOCIAL HISTORY Social History   Tobacco Use   Smoking status: Former    Current packs/day: 0.00    Average packs/day: 4.0 packs/day for 15.0 years (60.0 ttl pk-yrs)    Types: Cigarettes, E-cigarettes    Start date: 01/29/1998    Quit date: 01/29/2013    Years since quitting: 10.8   Smokeless tobacco: Never  Vaping Use   Vaping status: Every Day  Substance Use Topics   Alcohol use: No   Drug use: No       OPHTHALMIC EXAM:  Not recorded     IMAGING AND PROCEDURES  Imaging and Procedures for 01/01/2024          ASSESSMENT/PLAN:    ICD-10-CM   1. Central retinal vein occlusion with macular edema of left eye  H34.8120     2. Essential hypertension  I10     3. Hypertensive  retinopathy of both eyes  H35.033     4. Combined forms of age-related cataract of both eyes  H25.813     5. Corneal epithelial defect  H18.30       CRVO with retinal hemorrhages OS  - s/p IVA OS #1 (09.16.24), #2 (11.04.24), #3 (12.02.24), #4 (12.31.24)  - pt delayed to follow up from 4 to 7 weeks (11.04.24) - pt delayed to follow up from 6-8 weeks to 11 months (10.30.23-09.16.24) - originally referred by [facility] for retinal hemorrhages OS in October 2023 - originally presented to [facility] for updated glasses prescription -- otherwise asymptomatic - FA 10.30.23 shows Vascular nonperfusion temporal periphery; mild perivascular leakage from venules -- mild, remote RVO - repeat FA (09.16.24) shows staining of disc, mild  perivascular leakage temporal periphery and proximal venules -- CRVO OS - exam showed 360 peripheral DBH, some fine IRH in macula OS + dilated and tortuous venules OS - today, BCVA OS 20/150 - stable - OCT shows stable improvement in diffuse edema, residual IRF centrally--slightly improved, +vitreous opacities -- improving at 4 wks - recommend IVA OS #5 today, 1.28.25 w/ f/u in 4 wks - pt wishes to proceed with injection - RBA of procedure discussed, questions answered - IVA informed consent obtained and signed, 09.16.24 - see procedure note - f/u 4 weeks, DFE, OCT, possible injxn  2,3. Hypertensive retinopathy OU  - BP in office 140-150s / 80-90s at initial visit in 2023 - discussed importance of tight BP control - recommend establishment of PCP and further evaluation and management of BP  4. Mixed Cataract OU - The symptoms of cataract, surgical options, and treatments and risks were discussed with patient. - discussed diagnosis and progression  - monitor for now  5. Corneal abrasion, OD  - pt reports 2-3 day history of FBS and eye rubbing  - punctate foci of fluorescein staining nasal quadrant OD  - begin Ofloxacin OD QID for 2 weeks  - f/u in 4 wks, sooner prn  Ophthalmic Meds Ordered this visit:  No orders of the defined types were placed in this encounter.    No follow-ups on file.  There are no Patient Instructions on file for this visit.  Explained the diagnoses, plan, and follow up with the patient and they expressed understanding.  Patient expressed understanding of the importance of proper follow up care.  This document serves as a record of services personally performed by Karie Chimera, MD, PhD. It was created on their behalf by Charlette Caffey, COT an ophthalmic technician. The creation of this record is the provider's dictation and/or activities during the visit.    Electronically signed by:  Charlette Caffey, COT  12/20/23 1:28 PM  Karie Chimera,  M.D., Ph.D. Diseases & Surgery of the Retina and Vitreous [facility]     Abbreviations: M myopia (nearsighted); A astigmatism; H hyperopia (farsighted); P presbyopia; Mrx spectacle prescription;  CTL contact lenses; OD right eye; OS left eye; OU both eyes  XT exotropia; ET esotropia; PEK punctate epithelial keratitis; PEE punctate epithelial erosions; DES dry eye syndrome; MGD meibomian gland dysfunction; ATs artificial tears; PFAT's preservative free artificial tears; NSC nuclear sclerotic cataract; PSC posterior subcapsular cataract; ERM epi-retinal membrane; PVD posterior vitreous detachment; RD retinal detachment; DM diabetes mellitus; DR diabetic retinopathy; NPDR non-proliferative diabetic retinopathy; PDR proliferative diabetic retinopathy; CSME clinically significant macular edema; DME diabetic macular edema; dbh dot blot hemorrhages; CWS cotton wool spot;  POAG primary open angle glaucoma; C/D cup-to-disc ratio; HVF humphrey visual field; GVF goldmann visual field; OCT optical coherence tomography; IOP intraocular pressure; BRVO Branch retinal vein occlusion; CRVO central retinal vein occlusion; CRAO central retinal artery occlusion; BRAO branch retinal artery occlusion; RT retinal tear; SB scleral buckle; PPV pars plana vitrectomy; VH Vitreous hemorrhage; PRP panretinal laser photocoagulation; IVK intravitreal kenalog; VMT vitreomacular traction; MH Macular hole;  NVD neovascularization of the disc; NVE neovascularization elsewhere; AREDS age related eye disease study; ARMD age related macular degeneration; POAG primary open angle glaucoma; EBMD epithelial/anterior basement membrane dystrophy; ACIOL anterior chamber intraocular lens; IOL intraocular lens; PCIOL posterior chamber intraocular lens; Phaco/IOL phacoemulsification with intraocular lens placement; PRK photorefractive keratectomy; LASIK laser assisted in situ keratomileusis; HTN hypertension; DM diabetes mellitus;  COPD chronic obstructive pulmonary disease

## 2024-01-01 ENCOUNTER — Encounter (INDEPENDENT_AMBULATORY_CARE_PROVIDER_SITE_OTHER): Payer: Medicaid Other | Admitting: Ophthalmology

## 2024-01-01 ENCOUNTER — Ambulatory Visit (INDEPENDENT_AMBULATORY_CARE_PROVIDER_SITE_OTHER): Payer: Medicaid Other | Admitting: Ophthalmology

## 2024-01-01 ENCOUNTER — Encounter (INDEPENDENT_AMBULATORY_CARE_PROVIDER_SITE_OTHER): Payer: Self-pay | Admitting: Ophthalmology

## 2024-01-01 DIAGNOSIS — H3562 Retinal hemorrhage, left eye: Secondary | ICD-10-CM

## 2024-01-01 DIAGNOSIS — H35033 Hypertensive retinopathy, bilateral: Secondary | ICD-10-CM

## 2024-01-01 DIAGNOSIS — H1830 Unspecified corneal membrane change: Secondary | ICD-10-CM | POA: Diagnosis not present

## 2024-01-01 DIAGNOSIS — H25813 Combined forms of age-related cataract, bilateral: Secondary | ICD-10-CM

## 2024-01-01 DIAGNOSIS — I10 Essential (primary) hypertension: Secondary | ICD-10-CM | POA: Diagnosis not present

## 2024-01-01 DIAGNOSIS — H34812 Central retinal vein occlusion, left eye, with macular edema: Secondary | ICD-10-CM

## 2024-01-01 MED ORDER — BEVACIZUMAB CHEMO INJECTION 1.25MG/0.05ML SYRINGE FOR KALEIDOSCOPE
1.2500 mg | INTRAVITREAL | Status: AC | PRN
  Administered 2024-01-01: 1.25 mg via INTRAVITREAL

## 2024-01-01 NOTE — Progress Notes (Signed)
[facility] - Clinic Note  01/01/2024     CHIEF COMPLAINT Patient presents for Retina Follow Up   HISTORY OF PRESENT ILLNESS: Alan Goodman is a 58 y.o. male who presents to the clinic today for:   HPI     Retina Follow Up   Patient presents with  CRVO/BRVO.  In left eye.  This started 4 weeks ago.  I, the attending physician,  performed the HPI with the patient and updated documentation appropriately.        Comments   Pt scheduled to see an Optometrist in [place] for disability exam.      Last edited by Rennis Chris, MD on 01/02/2024 12:16 AM.    Patient states his vision is stable, disability wants pt to see Dr. Wyline Mood in [place] so he has an appt there next week, he is still using Ofloxacin for a corneal abrasion, but states his eye does not hurt anymore  Referring physician: Jeani Sow, MD [address] [place],  [place] [address]  HISTORICAL INFORMATION:   Selected notes from the MEDICAL RECORD NUMBER Referred by Dr. Bufford Spikes at [facility], [facility] for retinal hemorrhages OS LEE:  Ocular Hx- PMH-    CURRENT MEDICATIONS: No current outpatient medications on file. (Ophthalmic Drugs)   No current facility-administered medications for this visit. (Ophthalmic Drugs)   Current Outpatient Medications (Other)  Medication Sig   acetaminophen (TYLENOL) 325 MG tablet Take 2 tablets (650 mg total) by mouth every 6 (six) hours as needed for mild pain (or temp > 100).   albuterol (VENTOLIN HFA) 108 (90 Base) MCG/ACT inhaler Inhale 2 puffs into the lungs every 6 (six) hours as needed for wheezing or shortness of breath.   aspirin EC 81 MG tablet Take 1 tablet (81 mg total) by mouth daily. Swallow whole.   atorvastatin (LIPITOR) 20 MG tablet Take 1 tablet (20 mg total) by mouth daily.   cyanocobalamin (VITAMIN B12) 1000 MCG tablet Take 1,000 mcg by mouth daily.   No current facility-administered medications for this visit. (Other)    REVIEW OF SYSTEMS: ROS   Positive for: Cardiovascular, Eyes Last edited by Rennis Chris, MD on 01/02/2024 12:16 AM.      ALLERGIES No Known Allergies  PAST MEDICAL HISTORY Past Medical History:  Diagnosis Date   GERD (gastroesophageal reflux disease)    Past Surgical History:  Procedure Laterality Date   CHOLECYSTECTOMY N/A 01/30/2019   Procedure: LAPAROSCOPIC CHOLECYSTECTOMY;  Surgeon: Romie Levee, MD;  Location: [facility];  Service: General;  Laterality: N/A;   INCISION AND DRAINAGE ABSCESS Left 03/31/2016   Procedure: INCISION AND DRAINAGE OF LEFT INNER THIGH ABSCESS;  Surgeon: Chevis Pretty III, MD;  Location: [facility];  Service: General;  Laterality: Left;   NECK SURGERY     plate   FAMILY HISTORY Family History  Problem Relation Age of Onset   Cancer Mother 61 - 75       lung   Heart disease Father 35 - 38   Cancer Maternal Grandfather 45 - 79       colon   Heart disease Maternal Grandfather     SOCIAL HISTORY Social History   Tobacco Use   Smoking status: Former    Current packs/day: 0.00    Average packs/day: 4.0 packs/day for 15.0 years (60.0 ttl pk-yrs)    Types: Cigarettes, E-cigarettes    Start date: 01/29/1998    Quit date: 01/29/2013    Years since quitting: 10.9  Smokeless tobacco: Never  Vaping Use   Vaping status: Every Day  Substance Use Topics   Alcohol use: No   Drug use: No       OPHTHALMIC EXAM:  Base Eye Exam     Visual Acuity (Snellen - Linear)       Right Left   Dist cc 20/25 20/150 -2   Dist ph cc NI NI    Correction: Glasses         Tonometry (Tonopen, 2:26 PM)       Right Left   Pressure 17 13         Pupils       Dark Light Shape React APD   Right 3 2 Round Brisk None   Left 3 2 Round Brisk None         Visual Fields (Counting fingers)       Left Right    Full Full         Extraocular Movement       Right Left    Full, Ortho Full, Ortho         Neuro/Psych     Oriented x3: Yes    Mood/Affect: Normal         Dilation     Both eyes: 1.0% Mydriacyl, 2.5% Phenylephrine @ 2:26 PM           Slit Lamp and Fundus Exam     Slit Lamp Exam       Right Left   Lids/Lashes Dermatochalasis - upper lid, mild MGD Dermatochalasis - upper lid, mild MGD   Conjunctiva/Sclera Mild temporal pinguecula, + mucus temporal pinguecula   Cornea 1+ Punctate epithelial erosions, tear film debris, healed epi defect 0330 mid zone arcus, trace PEE   Anterior Chamber deep and clear deep and clear   Iris Round and dilated Round and dilated   Lens 2+ Nuclear sclerosis, 2+ Cortical cataract 2+ Nuclear sclerosis, 2+ Cortical cataract   Anterior Vitreous mild syneresis mild syneresis, Posterior vitreous detachment         Fundus Exam       Right Left   Disc Pink and Sharp +edema, blurred margin, +heme 360 -- improved   C/D Ratio 0.2 0.2   Macula Flat, Good foveal reflex, mild RPE mottling, No heme or edema central edema, diffuse DBH -- improving, scattered exudates -- improving   Vessels attenuated, mild tortuosity Attenuated arterioles, dilated venules, CRVO   Periphery Attached, focal pigmented CR atrophy at 0430, No heme Attached, scattered DBH greatest temporal periphery -- improving           Refraction     Wearing Rx       Sphere Cylinder Axis Add   Right -1.25 +2.75 177 +2.50   Left -2.50 +3.75 175 +2.50            IMAGING AND PROCEDURES  Imaging and Procedures for 01/01/2024  OCT, Retina - OU - Both Eyes       Right Eye Quality was good. Central Foveal Thickness: 293. Progression has been stable. Findings include normal foveal contour, no IRF, no SRF.   Left Eye Quality was good. Central Foveal Thickness: 539. Progression has improved. Findings include no SRF, abnormal foveal contour, intraretinal hyper-reflective material, intraretinal fluid, macular pucker (stable improvement in diffuse edema, residual IRF centrally -- slightly improved, +vitreous  opacities -- improving).   Notes *Images captured and stored on drive  Diagnosis / Impression:  OD: NFP; no IRF/SRF  OS:  stable improvement in diffuse edema, residual IRF centrally -- slightly improved, +vitreous opacities -- improving  Clinical management:  See below  Abbreviations: NFP - Normal foveal profile. CME - cystoid macular edema. PED - pigment epithelial detachment. IRF - intraretinal fluid. SRF - subretinal fluid. EZ - ellipsoid zone. ERM - epiretinal membrane. ORA - outer retinal atrophy. ORT - outer retinal tubulation. SRHM - subretinal hyper-reflective material. IRHM - intraretinal hyper-reflective material      Intravitreal Injection, Pharmacologic Agent - OS - Left Eye       Time Out 01/01/2024. 3:14 PM. Confirmed correct patient, procedure, site, and patient consented.   Anesthesia Topical anesthesia was used. Anesthetic medications included Lidocaine 2%, Proparacaine 0.5%.   Procedure Preparation included 5% betadine to ocular surface, eyelid speculum. A supplied (32g) needle was used.   Injection: 1.25 mg Bevacizumab 1.25mg /0.20ml   Route: Intravitreal, Site: Left Eye   NDC: [id number], Lot: [id number], Expiration date: 02/01/2024   Post-op Post injection exam found visual acuity of at least counting fingers. The patient tolerated the procedure well. There were no complications. The patient received written and verbal post procedure care education. Post injection medications were not given.            ASSESSMENT/PLAN:   ICD-10-CM   1. Central retinal vein occlusion with macular edema of left eye  H34.8120 OCT, Retina - OU - Both Eyes    Intravitreal Injection, Pharmacologic Agent - OS - Left Eye    Bevacizumab (AVASTIN) SOLN 1.25 mg    2. Essential hypertension  I10     3. Hypertensive retinopathy of both eyes  H35.033     4. Combined forms of age-related cataract of both eyes  H25.813     5. Corneal epithelial defect  H18.30      CRVO with  retinal hemorrhages OS  - s/p IVA OS #1 (09.16.24), #2 (11.04.24), #3 (12.02.24), #4 (12.31.24)  - pt delayed to follow up from 4 to 7 weeks (11.04.24) - pt delayed to follow up from 6-8 weeks to 11 months (10.30.23-09.16.24) - originally referred by [facility] for retinal hemorrhages OS in October 2023 - originally presented to [facility] for updated glasses prescription -- otherwise asymptomatic - FA 10.30.23 shows Vascular nonperfusion temporal periphery; mild perivascular leakage from venules -- mild, remote RVO - repeat FA (09.16.24) shows staining of disc, mild perivascular leakage temporal periphery and proximal venules -- CRVO OS - exam showed 360 peripheral DBH, some fine IRH in macula OS + dilated and tortuous venules OS - today, BCVA OS 20/150 - stable - OCT shows stable improvement in diffuse edema, residual IRF centrally -- slightly improved, +vitreous opacities -- improving at 4 wks - recommend IVA OS #5 today, 01.28.25 w/ f/u in 4 wks - pt wishes to proceed with injection - RBA of procedure discussed, questions answered - IVA informed consent obtained and signed, 09.16.24 - see procedure note - f/u 4 weeks, DFE, OCT, possible injxn  2,3. Hypertensive retinopathy OU  - BP in office 140-150s / 80-90s at initial visit in 2023 - discussed importance of tight BP control - recommend establishment of PCP and further evaluation and management of BP  4. Mixed Cataract OU - The symptoms of cataract, surgical options, and treatments and risks were discussed with patient. - discussed diagnosis and progression  - monitor for now  5. Corneal abrasion, OD  - pt reports 2-3 day history of FBS and eye rubbing  - punctate foci of fluorescein  staining nasal quadrant OD at last visit - - fully healed today on Ofloxacin OD QID -- okay to stop   - f/u in 4 wks, sooner prn  Ophthalmic Meds Ordered this visit:  Meds ordered this encounter  Medications   Bevacizumab (AVASTIN) SOLN 1.25 mg      Return in about 4 weeks (around 01/29/2024) for f/u CRVO OS, DFE, OCT.  There are no Patient Instructions on file for this visit.  Explained the diagnoses, plan, and follow up with the patient and they expressed understanding.  Patient expressed understanding of the importance of proper follow up care.   This document serves as a record of services personally performed by Karie Chimera, MD, PhD. It was created on their behalf by Glee Arvin. Manson Passey, OA an ophthalmic technician. The creation of this record is the provider's dictation and/or activities during the visit.    Electronically signed by: Glee Arvin. Manson Passey, OA 01/02/24 12:22 AM   Karie Chimera, M.D., Ph.D. Diseases & Surgery of the Retina and Vitreous [facility]  I have reviewed the above documentation for accuracy and completeness, and I agree with the above. Karie Chimera, M.D., Ph.D. 01/02/24 12:22 AM   Abbreviations: M myopia (nearsighted); A astigmatism; H hyperopia (farsighted); P presbyopia; Mrx spectacle prescription;  CTL contact lenses; OD right eye; OS left eye; OU both eyes  XT exotropia; ET esotropia; PEK punctate epithelial keratitis; PEE punctate epithelial erosions; DES dry eye syndrome; MGD meibomian gland dysfunction; ATs artificial tears; PFAT's preservative free artificial tears; NSC nuclear sclerotic cataract; PSC posterior subcapsular cataract; ERM epi-retinal membrane; PVD posterior vitreous detachment; RD retinal detachment; DM diabetes mellitus; DR diabetic retinopathy; NPDR non-proliferative diabetic retinopathy; PDR proliferative diabetic retinopathy; CSME clinically significant macular edema; DME diabetic macular edema; dbh dot blot hemorrhages; CWS cotton wool spot; POAG primary open angle glaucoma; C/D cup-to-disc ratio; HVF humphrey visual field; GVF goldmann visual field; OCT optical coherence tomography; IOP intraocular pressure; BRVO Branch retinal vein occlusion; CRVO central  retinal vein occlusion; CRAO central retinal artery occlusion; BRAO branch retinal artery occlusion; RT retinal tear; SB scleral buckle; PPV pars plana vitrectomy; VH Vitreous hemorrhage; PRP panretinal laser photocoagulation; IVK intravitreal kenalog; VMT vitreomacular traction; MH Macular hole;  NVD neovascularization of the disc; NVE neovascularization elsewhere; AREDS age related eye disease study; ARMD age related macular degeneration; POAG primary open angle glaucoma; EBMD epithelial/anterior basement membrane dystrophy; ACIOL anterior chamber intraocular lens; IOL intraocular lens; PCIOL posterior chamber intraocular lens; Phaco/IOL phacoemulsification with intraocular lens placement; PRK photorefractive keratectomy; LASIK laser assisted in situ keratomileusis; HTN hypertension; DM diabetes mellitus; COPD chronic obstructive pulmonary disease

## 2024-01-02 ENCOUNTER — Encounter: Payer: Self-pay | Admitting: Family Medicine

## 2024-01-02 ENCOUNTER — Ambulatory Visit (INDEPENDENT_AMBULATORY_CARE_PROVIDER_SITE_OTHER): Payer: Medicaid Other | Admitting: Family Medicine

## 2024-01-02 VITALS — BP 108/60 | HR 82 | Temp 97.5°F | Resp 18 | Ht 69.0 in | Wt 269.2 lb

## 2024-01-02 DIAGNOSIS — E538 Deficiency of other specified B group vitamins: Secondary | ICD-10-CM | POA: Diagnosis not present

## 2024-01-02 DIAGNOSIS — I2510 Atherosclerotic heart disease of native coronary artery without angina pectoris: Secondary | ICD-10-CM | POA: Diagnosis not present

## 2024-01-02 DIAGNOSIS — Z1211 Encounter for screening for malignant neoplasm of colon: Secondary | ICD-10-CM | POA: Diagnosis not present

## 2024-01-02 DIAGNOSIS — H34812 Central retinal vein occlusion, left eye, with macular edema: Secondary | ICD-10-CM

## 2024-01-02 DIAGNOSIS — Z1212 Encounter for screening for malignant neoplasm of rectum: Secondary | ICD-10-CM | POA: Diagnosis not present

## 2024-01-02 DIAGNOSIS — R0609 Other forms of dyspnea: Secondary | ICD-10-CM | POA: Diagnosis not present

## 2024-01-02 DIAGNOSIS — I2584 Coronary atherosclerosis due to calcified coronary lesion: Secondary | ICD-10-CM | POA: Diagnosis not present

## 2024-01-02 DIAGNOSIS — Z122 Encounter for screening for malignant neoplasm of respiratory organs: Secondary | ICD-10-CM | POA: Diagnosis not present

## 2024-01-02 LAB — COMPREHENSIVE METABOLIC PANEL
ALT: 17 U/L (ref 0–53)
AST: 16 U/L (ref 0–37)
Albumin: 4 g/dL (ref 3.5–5.2)
Alkaline Phosphatase: 102 U/L (ref 39–117)
BUN: 11 mg/dL (ref 6–23)
CO2: 28 meq/L (ref 19–32)
Calcium: 9.2 mg/dL (ref 8.4–10.5)
Chloride: 104 meq/L (ref 96–112)
Creatinine, Ser: 1.24 mg/dL (ref 0.40–1.50)
GFR: 64.62 mL/min (ref >60.00)
Glucose, Bld: 82 mg/dL (ref 70–99)
Potassium: 4 meq/L (ref 3.5–5.1)
Sodium: 142 meq/L (ref 135–145)
Total Bilirubin: 0.4 mg/dL (ref 0.2–1.2)
Total Protein: 7.1 g/dL (ref 6.0–8.3)

## 2024-01-02 LAB — LIPID PANEL
Cholesterol: 147 mg/dL (ref 0–200)
HDL: 45.7 mg/dL (ref >39.00)
LDL Cholesterol: 87 mg/dL (ref 0–99)
NonHDL: 100.99
Total CHOL/HDL Ratio: 3
Triglycerides: 70 mg/dL (ref 0.0–149.0)
VLDL: 14 mg/dL (ref 0.0–40.0)

## 2024-01-02 LAB — HEMOGLOBIN A1C: Hgb A1c MFr Bld: 6 % (ref 4.6–6.5)

## 2024-01-02 LAB — CBC WITH DIFFERENTIAL/PLATELET
Basophils Absolute: 0.1 10*3/uL (ref 0.0–0.1)
Basophils Relative: 1.2 % (ref 0.0–3.0)
Eosinophils Absolute: 0.2 10*3/uL (ref 0.0–0.7)
Eosinophils Relative: 2.4 % (ref 0.0–5.0)
HCT: 46.8 % (ref 39.0–52.0)
Hemoglobin: 15.6 g/dL (ref 13.0–17.0)
Lymphocytes Relative: 25.3 % (ref 12.0–46.0)
Lymphs Abs: 2 10*3/uL (ref 0.7–4.0)
MCHC: 33.3 g/dL (ref 30.0–36.0)
MCV: 88.7 fL (ref 78.0–100.0)
Monocytes Absolute: 0.9 10*3/uL (ref 0.1–1.0)
Monocytes Relative: 11.3 % (ref 3.0–12.0)
Neutro Abs: 4.7 10*3/uL (ref 1.4–7.7)
Neutrophils Relative %: 59.8 % (ref 43.0–77.0)
Platelets: 262 10*3/uL (ref 150.0–400.0)
RBC: 5.28 Mil/uL (ref 4.22–5.81)
RDW: 14.5 % (ref 11.5–15.5)
WBC: 7.8 10*3/uL (ref 4.0–10.5)

## 2024-01-02 LAB — VITAMIN B12: Vitamin B-12: 73 pg/mL — ABNORMAL LOW (ref 211–911)

## 2024-01-02 LAB — TSH: TSH: 2.7 u[IU]/mL (ref 0.35–5.50)

## 2024-01-02 MED ORDER — ALBUTEROL SULFATE HFA 108 (90 BASE) MCG/ACT IN AERS: 2.0000 | INHALATION_SPRAY | Freq: Four times a day (QID) | RESPIRATORY_TRACT | 1 refills | Status: AC | PRN

## 2024-01-02 MED ORDER — ATORVASTATIN CALCIUM 20 MG PO TABS: 20.0000 mg | ORAL_TABLET | Freq: Every day | ORAL | 3 refills | Status: AC

## 2024-01-02 NOTE — Patient Instructions (Addendum)
Take cholesterol meds daily and aspirin 81mg  daily(coated), b12 daily  Stop smoking

## 2024-01-02 NOTE — Progress Notes (Signed)
Labs ok except: 1.  Even though cholesterol low, still take the meds 2.  A1C(3 month average of sugars) is elevated.  This is considered PreDiabetes.  Work on diet-decrease sugars and starches and aim for 30 minutes of exercise 5 days/week to prevent progression to diabetes  3.  B12 is VERY low-get back on them or can even come in for injections.

## 2024-01-02 NOTE — Progress Notes (Signed)
Subjective:     Patient ID: Alan Goodman, male    DOB: 07/10/1966, 58 y.o.   MRN: [id number]  Chief Complaint  Patient presents with   Eye Swelling    Left eye swelling, was told my eye specialist that it maybe due to blood pressure, advised to see pcp    HPI Discussed the use of AI scribe software for clinical note transcription with the patient, who gave verbal consent to proceed.  History of Present Illness   The patient presents with vision problems in the left eye and shortness of breath. He is accompanied by his wife. He was referred by his eye doctor for evaluation of blood pressure.  He has been experiencing vision problems in his left eye for approximately one year, initially noticed during an eyeglass fitting. An eye doctor identified swelling behind the eye and a swollen blood vessel, leading to monthly injections. The eye doctor suggested that high blood pressure might be a contributing factor, although he states he has never had high blood pressure issues.  He experiences shortness of breath, which is alleviated by using an albuterol inhaler. He uses the inhaler primarily when engaging in activities that require walking long distances, as he becomes breathless. He does not use the inhaler daily and is currently out of it. He continues to vape. has had card w/u  He has a history of nonobstructive heart disease with calcium deposits in the arteries, identified during a CT scan by cardiology. He is supposed to take aspirin daily and cholesterol medication (atorvastatin) to prevent further heart issues, but he admits to not taking these medications regularly. He has some atorvastatin at home but is unsure of the quantity.  He reports swelling in his legs and hands, particularly in the morning, which improves with activity. He has lost ten pounds since his last visit a year ago.  No chest pain or tightness. He also denies drug use, including methamphetamines, speed, and  cocaine.       Health Maintenance Due  Topic Date Due   Pneumococcal Vaccine 19-27 Years old (1 of 2 - PCV) Never done   Fecal DNA (Cologuard)  Never done   Lung Cancer Screening  01/10/2024    Past Medical History:  Diagnosis Date   GERD (gastroesophageal reflux disease)     Past Surgical History:  Procedure Laterality Date   CHOLECYSTECTOMY N/A 01/30/2019   Procedure: LAPAROSCOPIC CHOLECYSTECTOMY;  Surgeon: Romie Levee, MD;  Location: [facility];  Service: General;  Laterality: N/A;   INCISION AND DRAINAGE ABSCESS Left 03/31/2016   Procedure: INCISION AND DRAINAGE OF LEFT INNER THIGH ABSCESS;  Surgeon: Chevis Pretty III, MD;  Location: [facility];  Service: General;  Laterality: Left;   NECK SURGERY     plate     Current Outpatient Medications:    acetaminophen (TYLENOL) 325 MG tablet, Take 2 tablets (650 mg total) by mouth every 6 (six) hours as needed for mild pain (or temp > 100)., Disp: 30 tablet, Rfl: 0   aspirin EC 81 MG tablet, Take 1 tablet (81 mg total) by mouth daily. Swallow whole., Disp: , Rfl:    albuterol (VENTOLIN HFA) 108 (90 Base) MCG/ACT inhaler, Inhale 2 puffs into the lungs every 6 (six) hours as needed for wheezing or shortness of breath., Disp: 8 g, Rfl: 1   atorvastatin (LIPITOR) 20 MG tablet, Take 1 tablet (20 mg total) by mouth daily., Disp: 90 tablet, Rfl: 3   cyanocobalamin (VITAMIN B12) 1000 MCG  tablet, Take 1,000 mcg by mouth daily. (Patient not taking: Reported on 01/02/2024), Disp: , Rfl:   No Known Allergies ROS neg/noncontributory except as noted HPI/below      Objective:     BP 108/60 (BP Location: Right Arm, Patient Position: Sitting, Cuff Size: Large)   Pulse 82   Temp (!) 97.5 F (36.4 C) (Temporal)   Resp 18   Ht 5\' 9"  (1.753 m)   Wt 269 lb 4 oz (122.1 kg)   SpO2 95%   BMI 39.76 kg/m  Wt Readings from Last 3 Encounters:  01/02/24 269 lb 4 oz (122.1 kg)  01/31/23 279 lb 9.6 oz (126.8 kg)  01/26/23 279 lb 6 oz (126.7 kg)    Physical  Exam   Gen: WDWN NAD HEENT: NCAT, conjunctiva not injected, sclera nonicteric NECK:  supple, no thyromegaly, no nodes, no carotid bruits CARDIAC: RRR, S1S2+, no murmur. DP 2+B LUNGS: CTAB. No wheezes ABDOMEN:  BS+, soft, NTND, No HSM, no masses EXT:  no edema MSK: no gross abnormalities.  NEURO: A&O x3.  CN II-XII intact.  PSYCH: normal mood. Good eye contact  Reviewed notes from retina and CT     Assessment & Plan:  Central retinal vein occlusion with macular edema of left eye -     Atorvastatin Calcium; Take 1 tablet (20 mg total) by mouth daily.  Dispense: 90 tablet; Refill: 3 -     CBC with Differential/Platelet -     Comprehensive metabolic panel -     Hemoglobin A1c -     Lipid panel -     TSH  Dyspnea on exertion -     Albuterol Sulfate HFA; Inhale 2 puffs into the lungs every 6 (six) hours as needed for wheezing or shortness of breath.  Dispense: 8 g; Refill: 1  Coronary artery disease due to calcified coronary lesion -     Atorvastatin Calcium; Take 1 tablet (20 mg total) by mouth daily.  Dispense: 90 tablet; Refill: 3 -     CBC with Differential/Platelet -     Comprehensive metabolic panel -     Hemoglobin A1c -     Lipid panel -     TSH  Vitamin B12 deficiency -     Vitamin B12  Screening for colorectal cancer -     Cologuard  Screening for lung cancer -     Ambulatory Referral for Lung Cancer Scre  Assessment and Plan    Nonobstructive Coronary Artery Disease Nonobstructive coronary artery disease with coronary calcium deposits presents an intermediate risk (69th percentile) for age. There is noncompliance with aspirin and atorvastatin, which are essential to prevent progression and potential myocardial infarction. Emphasized the importance of medication adherence to prevent worsening and potential need for stent or other interventions. Prescribe 81 mg aspirin and atorvastatin daily, and encourage adherence.  Chronic Obstructive Pulmonary Disease  (COPD) Ongoing dyspnea is managed with an albuterol inhaler, but nonadherence is noted due to running out of medication. Vaping continues, exacerbating the condition. Advised using albuterol before exertion and discussed the negative impact of vaping on COPD. Prescribe albuterol inhaler and strongly advise cessation of vaping.  Hypertensive Retinopathy Swelling behind the left eye is likely due to hypertensive retinopathy. Blood pressure today was 110/60 mmHg, but fluctuations may have caused retinal damage. Explained hypertensive damage to ocular blood vessels. Plan to monitor blood pressure regularly, perform blood work to check cholesterol levels, encourage adherence to antihypertensive measures, and coordinate care with an ophthalmologist.  General Health Maintenance Low B12 levels are noted with nonadherence to supplements. There is no colon or lung cancer screening despite a smoking history and family history of lung cancer. Discussed the importance of screenings. Prescribe B12 supplements daily, order a Cologuard test for colon cancer screening, and a pulm referral for lung cancer screening. Schedule a follow-up in 3 months for blood work and reassessment.  Follow-up Schedule a follow-up appointment in 3 months, perform blood work before the next visit, and ensure communication with the ophthalmologist regarding today's visit.        Return in about 3 months (around 04/01/2024) for cholesterol.  Angelena Sole, MD

## 2024-01-16 NOTE — Progress Notes (Shared)
[facility] - Clinic Note  01/29/2024     CHIEF COMPLAINT Patient presents for No chief complaint on file.   HISTORY OF PRESENT ILLNESS: Alan Goodman is a 58 y.o. male who presents to the clinic today for:    Patient states his vision is stable, disability wants pt to see Dr. Wyline Mood in [place] so he has an appt there next week, he is still using Ofloxacin for a corneal abrasion, but states his eye does not hurt anymore  Referring physician: Jeani Sow, MD [address] [place],  [place] [address]  HISTORICAL INFORMATION:   Selected notes from the MEDICAL RECORD NUMBER Referred by Dr. Bufford Spikes at [facility], [facility] for retinal hemorrhages OS LEE:  Ocular Hx- PMH-    CURRENT MEDICATIONS: No current outpatient medications on file. (Ophthalmic Drugs)   No current facility-administered medications for this visit. (Ophthalmic Drugs)   Current Outpatient Medications (Other)  Medication Sig   acetaminophen (TYLENOL) 325 MG tablet Take 2 tablets (650 mg total) by mouth every 6 (six) hours as needed for mild pain (or temp > 100).   albuterol (VENTOLIN HFA) 108 (90 Base) MCG/ACT inhaler Inhale 2 puffs into the lungs every 6 (six) hours as needed for wheezing or shortness of breath.   aspirin EC 81 MG tablet Take 1 tablet (81 mg total) by mouth daily. Swallow whole.   atorvastatin (LIPITOR) 20 MG tablet Take 1 tablet (20 mg total) by mouth daily.   cyanocobalamin (VITAMIN B12) 1000 MCG tablet Take 1,000 mcg by mouth daily. (Patient not taking: Reported on 01/02/2024)   No current facility-administered medications for this visit. (Other)   REVIEW OF SYSTEMS:    ALLERGIES No Known Allergies  PAST MEDICAL HISTORY Past Medical History:  Diagnosis Date   GERD (gastroesophageal reflux disease)    Past Surgical History:  Procedure Laterality Date   CHOLECYSTECTOMY N/A 01/30/2019   Procedure: LAPAROSCOPIC CHOLECYSTECTOMY;  Surgeon: Romie Levee, MD;  Location: [facility];  Service: General;  Laterality: N/A;   INCISION AND DRAINAGE ABSCESS Left 03/31/2016   Procedure: INCISION AND DRAINAGE OF LEFT INNER THIGH ABSCESS;  Surgeon: Chevis Pretty III, MD;  Location: [facility];  Service: General;  Laterality: Left;   NECK SURGERY     plate   FAMILY HISTORY Family History  Problem Relation Age of Onset   Cancer Mother 77 - 67       lung   Heart disease Father 77 - 12   Cancer Maternal Grandfather 96 - 79       colon   Heart disease Maternal Grandfather     SOCIAL HISTORY Social History   Tobacco Use   Smoking status: Former    Current packs/day: 0.00    Average packs/day: 4.0 packs/day for 15.0 years (60.0 ttl pk-yrs)    Types: Cigarettes, E-cigarettes    Start date: 01/29/1998    Quit date: 01/29/2013    Years since quitting: 10.9   Smokeless tobacco: Never  Vaping Use   Vaping status: Every Day  Substance Use Topics   Alcohol use: No   Drug use: No       OPHTHALMIC EXAM:  Not recorded     IMAGING AND PROCEDURES  Imaging and Procedures for 01/29/2024          ASSESSMENT/PLAN:   ICD-10-CM   1. Central retinal vein occlusion with macular edema of left eye  H34.8120     2. Essential hypertension  I10  3. Hypertensive retinopathy of both eyes  H35.033     4. Combined forms of age-related cataract of both eyes  H25.813     5. Corneal epithelial defect  H18.30     6. Retinal hemorrhage of left eye  H35.62       CRVO with retinal hemorrhages OS  - s/p IVA OS #1 (09.16.24), #2 (11.04.24), #3 (12.02.24), #4 (12.31.24), #5 (01.28.25)  - pt delayed to follow up from 4 to 7 weeks (11.04.24) - pt delayed to follow up from 6-8 weeks to 11 months (10.30.23-09.16.24) - originally referred by [facility] for retinal hemorrhages OS in October 2023 - originally presented to [facility] for updated glasses prescription -- otherwise asymptomatic - FA 10.30.23 shows Vascular nonperfusion temporal periphery; mild  perivascular leakage from venules -- mild, remote RVO - repeat FA (09.16.24) shows staining of disc, mild perivascular leakage temporal periphery and proximal venules -- CRVO OS - exam showed 360 peripheral DBH, some fine IRH in macula OS + dilated and tortuous venules OS - today, BCVA OS 20/150 - stable - OCT shows stable improvement in diffuse edema, residual IRF centrally -- slightly improved, +vitreous opacities -- improving at 4 wks - recommend IVA OS #6 today, 02.25.25 w/ f/u in 4 wks - pt wishes to proceed with injection - RBA of procedure discussed, questions answered - IVA informed consent obtained and signed, 09.16.24 - see procedure note - f/u 4 weeks, DFE, OCT, possible injxn  2,3. Hypertensive retinopathy OU  - BP in office 140-150s / 80-90s at initial visit in 2023 - discussed importance of tight BP control - recommend establishment of PCP and further evaluation and management of BP  4. Mixed Cataract OU - The symptoms of cataract, surgical options, and treatments and risks were discussed with patient. - discussed diagnosis and progression  - monitor for now  5. Corneal abrasion, OD  - pt reports 2-3 day history of FBS and eye rubbing  - punctate foci of fluorescein staining nasal quadrant OD at last visit - - fully healed today on Ofloxacin OD QID -- okay to stop   - f/u in 4 wks, sooner prn  Ophthalmic Meds Ordered this visit:  No orders of the defined types were placed in this encounter.    No follow-ups on file.  There are no Patient Instructions on file for this visit.  Explained the diagnoses, plan, and follow up with the patient and they expressed understanding.  Patient expressed understanding of the importance of proper follow up care.  This document serves as a record of services personally performed by Karie Chimera, MD, PhD. It was created on their behalf by Charlette Caffey, COT an ophthalmic technician. The creation of this record is the  provider's dictation and/or activities during the visit.    Electronically signed by:  Charlette Caffey, COT  01/16/24 10:31 AM  Karie Chimera, M.D., Ph.D. Diseases & Surgery of the Retina and Vitreous [facility]     Abbreviations: M myopia (nearsighted); A astigmatism; H hyperopia (farsighted); P presbyopia; Mrx spectacle prescription;  CTL contact lenses; OD right eye; OS left eye; OU both eyes  XT exotropia; ET esotropia; PEK punctate epithelial keratitis; PEE punctate epithelial erosions; DES dry eye syndrome; MGD meibomian gland dysfunction; ATs artificial tears; PFAT's preservative free artificial tears; NSC nuclear sclerotic cataract; PSC posterior subcapsular cataract; ERM epi-retinal membrane; PVD posterior vitreous detachment; RD retinal detachment; DM diabetes mellitus; DR diabetic  retinopathy; NPDR non-proliferative diabetic retinopathy; PDR proliferative diabetic retinopathy; CSME clinically significant macular edema; DME diabetic macular edema; dbh dot blot hemorrhages; CWS cotton wool spot; POAG primary open angle glaucoma; C/D cup-to-disc ratio; HVF humphrey visual field; GVF goldmann visual field; OCT optical coherence tomography; IOP intraocular pressure; BRVO Branch retinal vein occlusion; CRVO central retinal vein occlusion; CRAO central retinal artery occlusion; BRAO branch retinal artery occlusion; RT retinal tear; SB scleral buckle; PPV pars plana vitrectomy; VH Vitreous hemorrhage; PRP panretinal laser photocoagulation; IVK intravitreal kenalog; VMT vitreomacular traction; MH Macular hole;  NVD neovascularization of the disc; NVE neovascularization elsewhere; AREDS age related eye disease study; ARMD age related macular degeneration; POAG primary open angle glaucoma; EBMD epithelial/anterior basement membrane dystrophy; ACIOL anterior chamber intraocular lens; IOL intraocular lens; PCIOL posterior chamber intraocular lens; Phaco/IOL phacoemulsification  with intraocular lens placement; PRK photorefractive keratectomy; LASIK laser assisted in situ keratomileusis; HTN hypertension; DM diabetes mellitus; COPD chronic obstructive pulmonary disease

## 2024-01-29 ENCOUNTER — Encounter (INDEPENDENT_AMBULATORY_CARE_PROVIDER_SITE_OTHER): Payer: Medicaid Other | Admitting: Ophthalmology

## 2024-01-29 DIAGNOSIS — H3562 Retinal hemorrhage, left eye: Secondary | ICD-10-CM

## 2024-01-29 DIAGNOSIS — H1830 Unspecified corneal membrane change: Secondary | ICD-10-CM

## 2024-01-29 DIAGNOSIS — H25813 Combined forms of age-related cataract, bilateral: Secondary | ICD-10-CM

## 2024-01-29 DIAGNOSIS — I10 Essential (primary) hypertension: Secondary | ICD-10-CM

## 2024-01-29 DIAGNOSIS — H35033 Hypertensive retinopathy, bilateral: Secondary | ICD-10-CM

## 2024-01-29 DIAGNOSIS — H34812 Central retinal vein occlusion, left eye, with macular edema: Secondary | ICD-10-CM

## 2024-02-12 NOTE — Progress Notes (Signed)
 [facility] - Clinic Note  02/14/2024     CHIEF COMPLAINT Patient presents for Retina Follow Up   HISTORY OF PRESENT ILLNESS: Alan Goodman is a 58 y.o. male who presents to the clinic today for:   HPI     Retina Follow Up   Patient presents with  CRVO/BRVO.  In left eye.  This started 4 weeks ago.  Duration of 4 weeks.  Since onset it is stable.  I, the attending physician,  performed the HPI with the patient and updated documentation appropriately.        Comments   4 week retina follow up CRVO OS and IVA OS pt is reporting no vision changes noticed he denies any flashes has some floaters       Last edited by Rennis Chris, MD on 02/14/2024 12:53 PM.    Patient states   Referring physician: Jeani Sow, MD [address] [place],  [place] [address]  HISTORICAL INFORMATION:   Selected notes from the MEDICAL RECORD NUMBER Referred by Dr. Bufford Spikes at [facility] for retinal hemorrhages OS LEE:  Ocular Hx- PMH-    CURRENT MEDICATIONS: No current outpatient medications on file. (Ophthalmic Drugs)   No current facility-administered medications for this visit. (Ophthalmic Drugs)   Current Outpatient Medications (Other)  Medication Sig   acetaminophen (TYLENOL) 325 MG tablet Take 2 tablets (650 mg total) by mouth every 6 (six) hours as needed for mild pain (or temp > 100).   albuterol (VENTOLIN HFA) 108 (90 Base) MCG/ACT inhaler Inhale 2 puffs into the lungs every 6 (six) hours as needed for wheezing or shortness of breath.   aspirin EC 81 MG tablet Take 1 tablet (81 mg total) by mouth daily. Swallow whole.   atorvastatin (LIPITOR) 20 MG tablet Take 1 tablet (20 mg total) by mouth daily.   cyanocobalamin (VITAMIN B12) 1000 MCG tablet Take 1,000 mcg by mouth daily. (Patient not taking: Reported on 01/02/2024)   No current facility-administered medications for this visit. (Other)   REVIEW OF SYSTEMS: ROS   Positive for:  Cardiovascular, Eyes Last edited by Etheleen Mayhew, COT on 02/14/2024  9:41 AM.     ALLERGIES No Known Allergies  PAST MEDICAL HISTORY Past Medical History:  Diagnosis Date   GERD (gastroesophageal reflux disease)    Past Surgical History:  Procedure Laterality Date   CHOLECYSTECTOMY N/A 01/30/2019   Procedure: LAPAROSCOPIC CHOLECYSTECTOMY;  Surgeon: Romie Levee, MD;  Location: [facility];  Service: General;  Laterality: N/A;   INCISION AND DRAINAGE ABSCESS Left 03/31/2016   Procedure: INCISION AND DRAINAGE OF LEFT INNER THIGH ABSCESS;  Surgeon: Chevis Pretty III, MD;  Location: [facility];  Service: General;  Laterality: Left;   NECK SURGERY     plate   FAMILY HISTORY Family History  Problem Relation Age of Onset   Cancer Mother 45 - 5       lung   Heart disease Father 71 - 10   Cancer Maternal Grandfather 67 - 79       colon   Heart disease Maternal Grandfather     SOCIAL HISTORY Social History   Tobacco Use   Smoking status: Former    Current packs/day: 0.00    Average packs/day: 4.0 packs/day for 15.0 years (60.0 ttl pk-yrs)    Types: Cigarettes, E-cigarettes    Start date: 01/29/1998    Quit date: 01/29/2013    Years since quitting: 11.0   Smokeless tobacco: Never  Vaping Use   Vaping status: Every Day  Substance Use Topics   Alcohol use: No   Drug use: No       OPHTHALMIC EXAM:  Base Eye Exam     Visual Acuity (Snellen - Linear)       Right Left   Dist cc 20/25 -1 20/200 -2   Dist ph cc NI NI    Correction: Glasses         Tonometry (Tonopen, 9:47 AM)       Right Left   Pressure 18 18         Pupils       Pupils Dark Light Shape React APD   Right PERRL 3 2 Round Brisk None   Left PERRL 3 2 Round Brisk None         Visual Fields       Left Right    Full Full         Extraocular Movement       Right Left    Full, Ortho Full, Ortho         Neuro/Psych     Oriented x3: Yes   Mood/Affect: Normal         Dilation      Both eyes: 2.5% Phenylephrine @ 9:47 AM           Slit Lamp and Fundus Exam     Slit Lamp Exam       Right Left   Lids/Lashes Dermatochalasis - upper lid, mild MGD Dermatochalasis - upper lid, mild MGD   Conjunctiva/Sclera Mild temporal pinguecula, + mucus temporal pinguecula   Cornea 1+ Punctate epithelial erosions, tear film debris, healed epi defect 0330 mid zone arcus, trace PEE   Anterior Chamber deep and clear deep and clear   Iris Round and dilated Round and dilated   Lens 2+ Nuclear sclerosis, 2+ Cortical cataract 2+ Nuclear sclerosis, 2+ Cortical cataract   Anterior Vitreous mild syneresis mild syneresis, Posterior vitreous detachment         Fundus Exam       Right Left   Disc Pink and Sharp +edema, blurred margin, +heme 360 -- improved   C/D Ratio 0.2 0.2   Macula Flat, Good foveal reflex, mild RPE mottling, No heme or edema central edema -- slightly increased, diffuse DBH -- improving, scattered exudates -- improving   Vessels attenuated, mild tortuosity Attenuated arterioles, dilated venules, CRVO   Periphery Attached, focal pigmented CR atrophy at 0430, No heme Attached, scattered DBH greatest temporal periphery -- improving           Refraction     Wearing Rx       Sphere Cylinder Axis Add   Right -1.25 +2.75 177 +2.50   Left -2.50 +3.75 175 +2.50            IMAGING AND PROCEDURES  Imaging and Procedures for 02/14/2024  OCT, Retina - OU - Both Eyes       Right Eye Quality was good. Central Foveal Thickness: 295. Progression has been stable. Findings include normal foveal contour, no IRF, no SRF.   Left Eye Quality was good. Central Foveal Thickness: 586. Progression has worsened. Findings include no SRF, abnormal foveal contour, intraretinal hyper-reflective material, intraretinal fluid, macular pucker (Mild interval increase in IRF centrally, +vitreous opacities -- improving).   Notes *Images captured and stored on  drive  Diagnosis / Impression:  OD: NFP; no IRF/SRF  OS: Mild interval increase in IRF centrally, +  vitreous opacities -- improving   Clinical management:  See below  Abbreviations: NFP - Normal foveal profile. CME - cystoid macular edema. PED - pigment epithelial detachment. IRF - intraretinal fluid. SRF - subretinal fluid. EZ - ellipsoid zone. ERM - epiretinal membrane. ORA - outer retinal atrophy. ORT - outer retinal tubulation. SRHM - subretinal hyper-reflective material. IRHM - intraretinal hyper-reflective material      Intravitreal Injection, Pharmacologic Agent - OS - Left Eye       Time Out 02/14/2024. 10:55 AM. Confirmed correct patient, procedure, site, and patient consented.   Anesthesia Topical anesthesia was used. Anesthetic medications included Lidocaine 2%, Proparacaine 0.5%.   Procedure Preparation included 5% betadine to ocular surface, eyelid speculum. A supplied (32g) needle was used.   Injection: 1.25 mg Bevacizumab 1.25mg /0.39ml   Route: Intravitreal, Site: Left Eye   NDC: 09811-914-78, Lot: [id number] , Expiration date: 03/10/2024   Post-op Post injection exam found visual acuity of at least counting fingers. The patient tolerated the procedure well. There were no complications. The patient received written and verbal post procedure care education. Post injection medications were not given.            ASSESSMENT/PLAN:   ICD-10-CM   1. Central retinal vein occlusion with macular edema of left eye  H34.8120 OCT, Retina - OU - Both Eyes    Intravitreal Injection, Pharmacologic Agent - OS - Left Eye    Bevacizumab (AVASTIN) SOLN 1.25 mg    2. Essential hypertension  I10     3. Hypertensive retinopathy of both eyes  H35.033     4. Combined forms of age-related cataract of both eyes  H25.813      1. CRVO with retinal hemorrhages OS  - s/p IVA OS #1 (09.16.24), #2 (11.04.24), #3 (12.02.24), #4 (12.31.24), #5 (01.28.25)  - delayed f/u - 6 wks instead of  4 (01.28.25 to 03.13.25)  - pt delayed to follow up from 4 to 7 weeks (11.04.24) - pt delayed to follow up from 6-8 weeks to 11 months (10.30.23-09.16.24) - originally referred by [facility] for retinal hemorrhages OS in October 2023 - originally presented to [facility] for updated glasses prescription -- otherwise asymptomatic - FA 10.30.23 shows Vascular nonperfusion temporal periphery; mild perivascular leakage from venules -- mild, remote RVO - repeat FA (09.16.24) shows staining of disc, mild perivascular leakage temporal periphery and proximal venules -- CRVO OS - exam showed 360 peripheral DBH, some fine IRH in macula OS + dilated and tortuous venules OS - today, BCVA OS decreased to 20/200 from 20/150 - OCT shows Mild interval increase in IRF centrally, +vitreous opacities -- improving at 6 wks - recommend IVA OS #6 today, 03.13.25 w/ f/u back to 4 wks - pt wishes to proceed with injection - RBA of procedure discussed, questions answered - IVA informed consent obtained and signed, 09.16.24 - see procedure note - f/u 4 weeks, DFE, OCT, possible injxn  2,3. Hypertensive retinopathy OU  - BP in office 140-150s / 80-90s at initial visit in 2023 - discussed importance of tight BP control - recommend establishment of PCP and further evaluation and management of BP  4. Mixed Cataract OU - The symptoms of cataract, surgical options, and treatments and risks were discussed with patient. - discussed diagnosis and progression  - monitor for now  Ophthalmic Meds Ordered this visit:  Meds ordered this encounter  Medications   Bevacizumab (AVASTIN) SOLN 1.25 mg     Return in about 4 weeks (  around 03/13/2024) for f/u CRVO OS, DFE, OCT, Possible Injxn.  There are no Patient Instructions on file for this visit.  Explained the diagnoses, plan, and follow up with the patient and they expressed understanding.  Patient expressed understanding of the importance of proper follow up care.    This document serves as a record of services personally performed by Karie Chimera, MD, PhD. It was created on their behalf by Annalee Genta, COMT. The creation of this record is the provider's dictation and/or activities during the visit.  Electronically signed by: Annalee Genta, COMT 02/14/24 12:57 PM  This document serves as a record of services personally performed by Karie Chimera, MD, PhD. It was created on their behalf by Glee Arvin. Manson Passey, OA an ophthalmic technician. The creation of this record is the provider's dictation and/or activities during the visit.    Electronically signed by: Glee Arvin. Manson Passey, OA 02/14/24 12:57 PM  Karie Chimera, M.D., Ph.D. Diseases & Surgery of the Retina and Vitreous [facility]  I have reviewed the above documentation for accuracy and completeness, and I agree with the above. Karie Chimera, M.D., Ph.D. 02/14/24 12:57 PM    Abbreviations: M myopia (nearsighted); A astigmatism; H hyperopia (farsighted); P presbyopia; Mrx spectacle prescription;  CTL contact lenses; OD right eye; OS left eye; OU both eyes  XT exotropia; ET esotropia; PEK punctate epithelial keratitis; PEE punctate epithelial erosions; DES dry eye syndrome; MGD meibomian gland dysfunction; ATs artificial tears; PFAT's preservative free artificial tears; NSC nuclear sclerotic cataract; PSC posterior subcapsular cataract; ERM epi-retinal membrane; PVD posterior vitreous detachment; RD retinal detachment; DM diabetes mellitus; DR diabetic retinopathy; NPDR non-proliferative diabetic retinopathy; PDR proliferative diabetic retinopathy; CSME clinically significant macular edema; DME diabetic macular edema; dbh dot blot hemorrhages; CWS cotton wool spot; POAG primary open angle glaucoma; C/D cup-to-disc ratio; HVF humphrey visual field; GVF goldmann visual field; OCT optical coherence tomography; IOP intraocular pressure; BRVO Branch retinal vein occlusion; CRVO central  retinal vein occlusion; CRAO central retinal artery occlusion; BRAO branch retinal artery occlusion; RT retinal tear; SB scleral buckle; PPV pars plana vitrectomy; VH Vitreous hemorrhage; PRP panretinal laser photocoagulation; IVK intravitreal kenalog; VMT vitreomacular traction; MH Macular hole;  NVD neovascularization of the disc; NVE neovascularization elsewhere; AREDS age related eye disease study; ARMD age related macular degeneration; POAG primary open angle glaucoma; EBMD epithelial/anterior basement membrane dystrophy; ACIOL anterior chamber intraocular lens; IOL intraocular lens; PCIOL posterior chamber intraocular lens; Phaco/IOL phacoemulsification with intraocular lens placement; PRK photorefractive keratectomy; LASIK laser assisted in situ keratomileusis; HTN hypertension; DM diabetes mellitus; COPD chronic obstructive pulmonary disease

## 2024-02-14 ENCOUNTER — Encounter (INDEPENDENT_AMBULATORY_CARE_PROVIDER_SITE_OTHER): Payer: Self-pay | Admitting: Ophthalmology

## 2024-02-14 ENCOUNTER — Ambulatory Visit (INDEPENDENT_AMBULATORY_CARE_PROVIDER_SITE_OTHER): Payer: Medicaid Other | Admitting: Ophthalmology

## 2024-02-14 DIAGNOSIS — H34812 Central retinal vein occlusion, left eye, with macular edema: Secondary | ICD-10-CM

## 2024-02-14 DIAGNOSIS — H1830 Unspecified corneal membrane change: Secondary | ICD-10-CM

## 2024-02-14 DIAGNOSIS — I10 Essential (primary) hypertension: Secondary | ICD-10-CM

## 2024-02-14 DIAGNOSIS — H35033 Hypertensive retinopathy, bilateral: Secondary | ICD-10-CM

## 2024-02-14 DIAGNOSIS — H25813 Combined forms of age-related cataract, bilateral: Secondary | ICD-10-CM | POA: Diagnosis not present

## 2024-02-14 MED ORDER — BEVACIZUMAB CHEMO INJECTION 1.25MG/0.05ML SYRINGE FOR KALEIDOSCOPE
1.2500 mg | INTRAVITREAL | Status: AC | PRN
Start: 2024-02-14 — End: 2024-02-14
  Administered 2024-02-14: 1.25 mg via INTRAVITREAL

## 2024-03-13 NOTE — Progress Notes (Signed)
 [facility] - Clinic Note  03/14/2024     CHIEF COMPLAINT Patient presents for Retina Follow Up   HISTORY OF PRESENT ILLNESS: Alan Goodman is a 58 y.o. male who presents to the clinic today for:   HPI     Retina Follow Up   Patient presents with  CRVO/BRVO.  In left eye.  This started 4 weeks ago.  I, the attending physician,  performed the HPI with the patient and updated documentation appropriately.        Comments   Patient here for 4 weeks retina follow up for CRVO OS. Patient states vision about the same. Eye hurts.       Last edited by Rennis Chris, MD on 03/14/2024  1:44 PM.    Patient states   Referring physician: Jeani Sow, MD [address] [place],  [place] [address]  HISTORICAL INFORMATION:   Selected notes from the MEDICAL RECORD NUMBER Referred by Dr. Bufford Spikes at [facility], [facility] for retinal hemorrhages OS LEE:  Ocular Hx- PMH-    CURRENT MEDICATIONS: No current outpatient medications on file. (Ophthalmic Drugs)   No current facility-administered medications for this visit. (Ophthalmic Drugs)   Current Outpatient Medications (Other)  Medication Sig   acetaminophen (TYLENOL) 325 MG tablet Take 2 tablets (650 mg total) by mouth every 6 (six) hours as needed for mild pain (or temp > 100).   albuterol (VENTOLIN HFA) 108 (90 Base) MCG/ACT inhaler Inhale 2 puffs into the lungs every 6 (six) hours as needed for wheezing or shortness of breath.   aspirin EC 81 MG tablet Take 1 tablet (81 mg total) by mouth daily. Swallow whole.   atorvastatin (LIPITOR) 20 MG tablet Take 1 tablet (20 mg total) by mouth daily.   cyanocobalamin (VITAMIN B12) 1000 MCG tablet Take 1,000 mcg by mouth daily. (Patient not taking: Reported on 03/14/2024)   No current facility-administered medications for this visit. (Other)   REVIEW OF SYSTEMS: ROS   Positive for: Cardiovascular, Eyes Last edited by Laddie Aquas, COA on 03/14/2024  1:30 PM.       ALLERGIES No Known Allergies  PAST MEDICAL HISTORY Past Medical History:  Diagnosis Date   GERD (gastroesophageal reflux disease)    Past Surgical History:  Procedure Laterality Date   CHOLECYSTECTOMY N/A 01/30/2019   Procedure: LAPAROSCOPIC CHOLECYSTECTOMY;  Surgeon: Romie Levee, MD;  Location: [facility];  Service: General;  Laterality: N/A;   INCISION AND DRAINAGE ABSCESS Left 03/31/2016   Procedure: INCISION AND DRAINAGE OF LEFT INNER THIGH ABSCESS;  Surgeon: Chevis Pretty III, MD;  Location: [facility];  Service: General;  Laterality: Left;   NECK SURGERY     plate   FAMILY HISTORY Family History  Problem Relation Age of Onset   Cancer Mother 6 - 58       lung   Heart disease Father 92 - 50   Cancer Maternal Grandfather 44 - 79       colon   Heart disease Maternal Grandfather     SOCIAL HISTORY Social History   Tobacco Use   Smoking status: Former    Current packs/day: 0.00    Average packs/day: 4.0 packs/day for 15.0 years (60.0 ttl pk-yrs)    Types: Cigarettes, E-cigarettes    Start date: 01/29/1998    Quit date: 01/29/2013    Years since quitting: 11.1   Smokeless tobacco: Never  Vaping Use   Vaping status: Every Day  Substance Use Topics  Alcohol use: No   Drug use: No       OPHTHALMIC EXAM:  Base Eye Exam     Visual Acuity (Snellen - Linear)       Right Left   Dist cc 20/20 -1 20/150 -1   Dist ph cc  NI    Correction: Glasses         Tonometry (Tonopen, 1:28 PM)       Right Left   Pressure 16 17         Pupils       Dark Light Shape React APD   Right 3 2 Round Brisk None   Left 3 2 Round Brisk None         Visual Fields (Counting fingers)       Left Right    Full Full         Extraocular Movement       Right Left    Full, Ortho Full, Ortho         Neuro/Psych     Oriented x3: Yes   Mood/Affect: Normal         Dilation     Both eyes: 1.0% Mydriacyl, 2.5% Phenylephrine @ 1:28 PM           Slit  Lamp and Fundus Exam     Slit Lamp Exam       Right Left   Lids/Lashes Dermatochalasis - upper lid, mild MGD Dermatochalasis - upper lid, mild MGD   Conjunctiva/Sclera Mild temporal pinguecula, + mucus temporal pinguecula   Cornea 1+ Punctate epithelial erosions, tear film debris, healed epi defect 0330 mid zone arcus, trace PEE   Anterior Chamber deep and clear deep and clear   Iris Round and dilated Round and dilated   Lens 2+ Nuclear sclerosis, 2+ Cortical cataract 2+ Nuclear sclerosis, 2+ Cortical cataract   Anterior Vitreous mild syneresis mild syneresis, Posterior vitreous detachment         Fundus Exam       Right Left   Disc Pink and Sharp +edema, blurred margin, +heme 360 -- improved   C/D Ratio 0.2 0.2   Macula Flat, Good foveal reflex, mild RPE mottling, No heme or edema central edema -- improved, diffuse DBH -- improving, scattered exudates -- improving   Vessels attenuated, mild tortuosity Attenuated arterioles, dilated venules, CRVO   Periphery Attached, focal pigmented CR atrophy at 0430, No heme Attached, scattered DBH greatest temporal periphery -- improving           Refraction     Wearing Rx       Sphere Cylinder Axis Add   Right -1.25 +2.75 177 +2.50   Left -2.50 +3.75 175 +2.50            IMAGING AND PROCEDURES  Imaging and Procedures for 03/14/2024  OCT, Retina - OU - Both Eyes       Right Eye Quality was good. Central Foveal Thickness: 292. Progression has been stable. Findings include normal foveal contour, no IRF, no SRF.   Left Eye Quality was good. Central Foveal Thickness: 313. Progression has improved. Findings include no SRF, abnormal foveal contour, intraretinal hyper-reflective material, intraretinal fluid, macular pucker (interval improvement in central IRF / edema, +vitreous opacities -- improving).   Notes *Images captured and stored on drive  Diagnosis / Impression:  OD: NFP; no IRF/SRF  OS: interval improvement in  central IRF / edema, +vitreous opacities -- improving  Clinical management:  See below  Abbreviations:  NFP - Normal foveal profile. CME - cystoid macular edema. PED - pigment epithelial detachment. IRF - intraretinal fluid. SRF - subretinal fluid. EZ - ellipsoid zone. ERM - epiretinal membrane. ORA - outer retinal atrophy. ORT - outer retinal tubulation. SRHM - subretinal hyper-reflective material. IRHM - intraretinal hyper-reflective material      Intravitreal Injection, Pharmacologic Agent - OS - Left Eye       Time Out 03/14/2024. 1:50 PM. Confirmed correct patient, procedure, site, and patient consented.   Anesthesia Topical anesthesia was used. Anesthetic medications included Lidocaine 2%, Proparacaine 0.5%.   Procedure Preparation included 5% betadine to ocular surface, eyelid speculum. A supplied (32g) needle was used.   Injection: 1.25 mg Bevacizumab 1.25mg /0.37ml   Route: Intravitreal, Site: Left Eye   NDC: 57846-962-95, Lot: [id number] , Expiration date: 04/12/2024   Post-op Post injection exam found visual acuity of at least counting fingers. The patient tolerated the procedure well. There were no complications. The patient received written and verbal post procedure care education. Post injection medications were not given.            ASSESSMENT/PLAN:   ICD-10-CM   1. Central retinal vein occlusion with macular edema of left eye  H34.8120 OCT, Retina - OU - Both Eyes    Intravitreal Injection, Pharmacologic Agent - OS - Left Eye    Bevacizumab (AVASTIN) SOLN 1.25 mg    2. Essential hypertension  I10     3. Hypertensive retinopathy of both eyes  H35.033     4. Combined forms of age-related cataract of both eyes  H25.813     5. Corneal epithelial defect  H18.30      1. CRVO with macular edema OS  - s/p IVA OS #1 (09.16.24), #2 (11.04.24), #3 (12.02.24), #4 (12.31.24), #5 (01.28.25) #6(03.13.25)  - delayed f/u - 6 wks instead of 4 (01.28.25 to 03.13.25)  -  pt delayed to follow up from 4 to 7 weeks (11.04.24) - pt delayed to follow up from 6-8 weeks to 11 months (10.30.23-09.16.24) - originally referred by [facility] for retinal hemorrhages OS in October 2023 - originally presented to [facility] for updated glasses prescription -- otherwise asymptomatic - FA 10.30.23 shows Vascular nonperfusion temporal periphery; mild perivascular leakage from venules -- mild, remote RVO - repeat FA (09.16.24) shows staining of disc, mild perivascular leakage temporal periphery and proximal venules -- CRVO OS - exam showed 360 peripheral DBH, some fine IRH in macula OS + dilated and tortuous venules OS - today, BCVA OS improved to 20/150 from 20/200 - OCT shows interval improvement in IRF centrally, +vitreous opacities -- improving at 4 wks - recommend IVA OS #7 today, 04.11.25 w/ f/u in 4 wks - pt wishes to proceed with injection - RBA of procedure discussed, questions answered - IVA informed consent obtained and signed, 09.16.24 - see procedure note - f/u 4 weeks, DFE, OCT, possible injxn - fax letter to: [phone] attn:Cindy Yetta Flock  2,3. Hypertensive retinopathy OU  - BP in office 140-150s / 80-90s at initial visit in 2023 - discussed importance of tight BP control - recommend establishment of PCP and further evaluation and management of BP  4. Mixed Cataract OU - The symptoms of cataract, surgical options, and treatments and risks were discussed with patient. - discussed diagnosis and progression  - monitor for now  Ophthalmic Meds Ordered this visit:  Meds ordered this encounter  Medications   Bevacizumab (AVASTIN) SOLN 1.25 mg     Return in  about 4 weeks (around 04/11/2024) for f/u CRVO OS, DFE, OCT, Possible Injxn.  There are no Patient Instructions on file for this visit.  This document serves as a record of services personally performed by Karie Chimera, MD, PhD. It was created on their behalf by Berlin Hun COT, an ophthalmic  technician. The creation of this record is the provider's dictation and/or activities during the visit.    Electronically signed by: Berlin Hun COT 04.10.253:34 PM  This document serves as a record of services personally performed by Karie Chimera, MD, PhD. It was created on their behalf by Glee Arvin. Manson Passey, OA an ophthalmic technician. The creation of this record is the provider's dictation and/or activities during the visit.    Electronically signed by: Glee Arvin. Manson Passey, OA 03/14/24 3:34 PM  Karie Chimera, M.D., Ph.D. Diseases & Surgery of the Retina and Vitreous [facility] 03/14/2024   I have reviewed the above documentation for accuracy and completeness, and I agree with the above. Karie Chimera, M.D., Ph.D. 03/14/24 3:34 PM   Abbreviations: M myopia (nearsighted); A astigmatism; H hyperopia (farsighted); P presbyopia; Mrx spectacle prescription;  CTL contact lenses; OD right eye; OS left eye; OU both eyes  XT exotropia; ET esotropia; PEK punctate epithelial keratitis; PEE punctate epithelial erosions; DES dry eye syndrome; MGD meibomian gland dysfunction; ATs artificial tears; PFAT's preservative free artificial tears; NSC nuclear sclerotic cataract; PSC posterior subcapsular cataract; ERM epi-retinal membrane; PVD posterior vitreous detachment; RD retinal detachment; DM diabetes mellitus; DR diabetic retinopathy; NPDR non-proliferative diabetic retinopathy; PDR proliferative diabetic retinopathy; CSME clinically significant macular edema; DME diabetic macular edema; dbh dot blot hemorrhages; CWS cotton wool spot; POAG primary open angle glaucoma; C/D cup-to-disc ratio; HVF humphrey visual field; GVF goldmann visual field; OCT optical coherence tomography; IOP intraocular pressure; BRVO Branch retinal vein occlusion; CRVO central retinal vein occlusion; CRAO central retinal artery occlusion; BRAO branch retinal artery occlusion; RT retinal tear; SB scleral  buckle; PPV pars plana vitrectomy; VH Vitreous hemorrhage; PRP panretinal laser photocoagulation; IVK intravitreal kenalog; VMT vitreomacular traction; MH Macular hole;  NVD neovascularization of the disc; NVE neovascularization elsewhere; AREDS age related eye disease study; ARMD age related macular degeneration; POAG primary open angle glaucoma; EBMD epithelial/anterior basement membrane dystrophy; ACIOL anterior chamber intraocular lens; IOL intraocular lens; PCIOL posterior chamber intraocular lens; Phaco/IOL phacoemulsification with intraocular lens placement; PRK photorefractive keratectomy; LASIK laser assisted in situ keratomileusis; HTN hypertension; DM diabetes mellitus; COPD chronic obstructive pulmonary disease

## 2024-03-14 ENCOUNTER — Ambulatory Visit (INDEPENDENT_AMBULATORY_CARE_PROVIDER_SITE_OTHER): Admitting: Ophthalmology

## 2024-03-14 ENCOUNTER — Encounter (INDEPENDENT_AMBULATORY_CARE_PROVIDER_SITE_OTHER): Payer: Self-pay | Admitting: Ophthalmology

## 2024-03-14 DIAGNOSIS — H1830 Unspecified corneal membrane change: Secondary | ICD-10-CM

## 2024-03-14 DIAGNOSIS — I10 Essential (primary) hypertension: Secondary | ICD-10-CM | POA: Diagnosis not present

## 2024-03-14 DIAGNOSIS — H34812 Central retinal vein occlusion, left eye, with macular edema: Secondary | ICD-10-CM | POA: Diagnosis not present

## 2024-03-14 DIAGNOSIS — H35033 Hypertensive retinopathy, bilateral: Secondary | ICD-10-CM

## 2024-03-14 DIAGNOSIS — H25813 Combined forms of age-related cataract, bilateral: Secondary | ICD-10-CM | POA: Diagnosis not present

## 2024-03-14 MED ORDER — BEVACIZUMAB CHEMO INJECTION 1.25MG/0.05ML SYRINGE FOR KALEIDOSCOPE
1.2500 mg | INTRAVITREAL | Status: AC | PRN
  Administered 2024-03-14: 1.25 mg via INTRAVITREAL

## 2024-04-01 ENCOUNTER — Ambulatory Visit: Payer: Medicaid Other | Admitting: Family Medicine

## 2024-04-02 NOTE — Progress Notes (Shared)
 [facility] - Clinic Note  04/15/2024     CHIEF COMPLAINT Patient presents for No chief complaint on file.   HISTORY OF PRESENT ILLNESS: Alan Goodman is a 58 y.o. male who presents to the clinic today for:    Patient states   Referring physician: Christel Cousins, MD [address] [place],  [place] [address]  HISTORICAL INFORMATION:   Selected notes from the MEDICAL RECORD NUMBER Referred by Dr. Jetty Mort at [facility] for retinal hemorrhages OS LEE:  Ocular Hx- PMH-    CURRENT MEDICATIONS: No current outpatient medications on file. (Ophthalmic Drugs)   No current facility-administered medications for this visit. (Ophthalmic Drugs)   Current Outpatient Medications (Other)  Medication Sig   acetaminophen  (TYLENOL ) 325 MG tablet Take 2 tablets (650 mg total) by mouth every 6 (six) hours as needed for mild pain (or temp > 100).   albuterol  (VENTOLIN  HFA) 108 (90 Base) MCG/ACT inhaler Inhale 2 puffs into the lungs every 6 (six) hours as needed for wheezing or shortness of breath.   aspirin  EC 81 MG tablet Take 1 tablet (81 mg total) by mouth daily. Swallow whole.   atorvastatin  (LIPITOR) 20 MG tablet Take 1 tablet (20 mg total) by mouth daily.   cyanocobalamin  (VITAMIN B12) 1000 MCG tablet Take 1,000 mcg by mouth daily. (Patient not taking: Reported on 03/14/2024)   No current facility-administered medications for this visit. (Other)   REVIEW OF SYSTEMS:    ALLERGIES No Known Allergies  PAST MEDICAL HISTORY Past Medical History:  Diagnosis Date   GERD (gastroesophageal reflux disease)    Past Surgical History:  Procedure Laterality Date   CHOLECYSTECTOMY N/A 01/30/2019   Procedure: LAPAROSCOPIC CHOLECYSTECTOMY;  Surgeon: Joyce Nixon, MD;  Location: [facility];  Service: General;  Laterality: N/A;   INCISION AND DRAINAGE ABSCESS Left 03/31/2016   Procedure: INCISION AND DRAINAGE OF LEFT INNER THIGH ABSCESS;  Surgeon: Lillette Reid III,  MD;  Location: [facility];  Service: General;  Laterality: Left;   NECK SURGERY     plate   FAMILY HISTORY Family History  Problem Relation Age of Onset   Cancer Mother 69 - 62       lung   Heart disease Father 20 - 48   Cancer Maternal Grandfather 60 - 79       colon   Heart disease Maternal Grandfather     SOCIAL HISTORY Social History   Tobacco Use   Smoking status: Former    Current packs/day: 0.00    Average packs/day: 4.0 packs/day for 15.0 years (60.0 ttl pk-yrs)    Types: Cigarettes, E-cigarettes    Start date: 01/29/1998    Quit date: 01/29/2013    Years since quitting: 11.1   Smokeless tobacco: Never  Vaping Use   Vaping status: Every Day  Substance Use Topics   Alcohol use: No   Drug use: No       OPHTHALMIC EXAM:  Not recorded     IMAGING AND PROCEDURES  Imaging and Procedures for 04/15/2024          ASSESSMENT/PLAN:   ICD-10-CM   1. Central retinal vein occlusion with macular edema of left eye  H34.8120     2. Essential hypertension  I10     3. Hypertensive retinopathy of both eyes  H35.033     4. Combined forms of age-related cataract of both eyes  H25.813       1. CRVO with macular edema OS  -  s/p IVA OS #1 (09.16.24), #2 (11.04.24), #3 (12.02.24), #4 (12.31.24), #5 (01.28.25) #6(03.13.25), #7 (04.11.25)  - delayed f/u - 6 wks instead of 4 (01.28.25 to 03.13.25)  - pt delayed to follow up from 4 to 7 weeks (11.04.24) - pt delayed to follow up from 6-8 weeks to 11 months (10.30.23-09.16.24) - originally referred by [facility] for retinal hemorrhages OS in October 2023 - originally presented to [facility] for updated glasses prescription -- otherwise asymptomatic - FA 10.30.23 shows Vascular nonperfusion temporal periphery; mild perivascular leakage from venules -- mild, remote RVO - repeat FA (09.16.24) shows staining of disc, mild perivascular leakage temporal periphery and proximal venules -- CRVO OS - exam showed 360 peripheral DBH, some  fine IRH in macula OS + dilated and tortuous venules OS - today, BCVA OS improved to 20/150 from 20/200 - OCT shows interval improvement in IRF centrally, +vitreous opacities -- improving at 4 wks - recommend IVA OS #8 today, 05.13.25 w/ f/u in 4 wks - pt wishes to proceed with injection - RBA of procedure discussed, questions answered - IVA informed consent obtained and signed, 09.16.24 - see procedure note - f/u 4 weeks, DFE, OCT, possible injxn - fax letter to: [phone] attn:Cindy Arlena Lacrosse  2,3. Hypertensive retinopathy OU  - BP in office 140-150s / 80-90s at initial visit in 2023 - discussed importance of tight BP control - recommend establishment of PCP and further evaluation and management of BP  4. Mixed Cataract OU - The symptoms of cataract, surgical options, and treatments and risks were discussed with patient. - discussed diagnosis and progression  - monitor for now  Ophthalmic Meds Ordered this visit:  No orders of the defined types were placed in this encounter.    No follow-ups on file.  There are no Patient Instructions on file for this visit.  This document serves as a record of services personally performed by Jeanice Millard, MD, PhD. It was created on their behalf by Olene Berne, COT an ophthalmic technician. The creation of this record is the provider's dictation and/or activities during the visit.    Electronically signed by:  Olene Berne, COT  04/02/24 8:03 AM   Jeanice Millard, M.D., Ph.D. Diseases & Surgery of the Retina and Vitreous [facility] 03/14/2024    Abbreviations: M myopia (nearsighted); A astigmatism; H hyperopia (farsighted); P presbyopia; Mrx spectacle prescription;  CTL contact lenses; OD right eye; OS left eye; OU both eyes  XT exotropia; ET esotropia; PEK punctate epithelial keratitis; PEE punctate epithelial erosions; DES dry eye syndrome; MGD meibomian gland dysfunction; ATs artificial tears;  PFAT's preservative free artificial tears; NSC nuclear sclerotic cataract; PSC posterior subcapsular cataract; ERM epi-retinal membrane; PVD posterior vitreous detachment; RD retinal detachment; DM diabetes mellitus; DR diabetic retinopathy; NPDR non-proliferative diabetic retinopathy; PDR proliferative diabetic retinopathy; CSME clinically significant macular edema; DME diabetic macular edema; dbh dot blot hemorrhages; CWS cotton wool spot; POAG primary open angle glaucoma; C/D cup-to-disc ratio; HVF humphrey visual field; GVF goldmann visual field; OCT optical coherence tomography; IOP intraocular pressure; BRVO Branch retinal vein occlusion; CRVO central retinal vein occlusion; CRAO central retinal artery occlusion; BRAO branch retinal artery occlusion; RT retinal tear; SB scleral buckle; PPV pars plana vitrectomy; VH Vitreous hemorrhage; PRP panretinal laser photocoagulation; IVK intravitreal kenalog; VMT vitreomacular traction; MH Macular hole;  NVD neovascularization of the disc; NVE neovascularization elsewhere; AREDS age related eye disease study; ARMD age related macular degeneration; POAG primary  open angle glaucoma; EBMD epithelial/anterior basement membrane dystrophy; ACIOL anterior chamber intraocular lens; IOL intraocular lens; PCIOL posterior chamber intraocular lens; Phaco/IOL phacoemulsification with intraocular lens placement; PRK photorefractive keratectomy; LASIK laser assisted in situ keratomileusis; HTN hypertension; DM diabetes mellitus; COPD chronic obstructive pulmonary disease

## 2024-04-15 ENCOUNTER — Encounter (INDEPENDENT_AMBULATORY_CARE_PROVIDER_SITE_OTHER): Admitting: Ophthalmology

## 2024-04-15 ENCOUNTER — Encounter (INDEPENDENT_AMBULATORY_CARE_PROVIDER_SITE_OTHER): Payer: Self-pay

## 2024-04-15 DIAGNOSIS — H25813 Combined forms of age-related cataract, bilateral: Secondary | ICD-10-CM

## 2024-04-15 DIAGNOSIS — H34812 Central retinal vein occlusion, left eye, with macular edema: Secondary | ICD-10-CM

## 2024-04-15 DIAGNOSIS — H35033 Hypertensive retinopathy, bilateral: Secondary | ICD-10-CM

## 2024-04-15 DIAGNOSIS — I10 Essential (primary) hypertension: Secondary | ICD-10-CM

## 2024-04-17 NOTE — Progress Notes (Signed)
 [facility] - Clinic Note  04/23/2024     CHIEF COMPLAINT Patient presents for Retina Follow Up   HISTORY OF PRESENT ILLNESS: Alan Goodman is a 58 y.o. male who presents to the clinic today for:   HPI     Retina Follow Up   Patient presents with  CRVO/BRVO.  In left eye.  This started 6 weeks ago.  Duration of weeks.  Since onset it is stable.  I, the attending physician,  performed the HPI with the patient and updated documentation appropriately.        Comments   6 week retina follow up CRVO OS and IVA OS  pt is reporting no vision changes noticed he denies any flashes or floaters       Last edited by Ronelle Coffee, MD on 04/23/2024 10:13 PM.     Patient states the vision is the same. He has had dry eyes.   Referring physician: Christel Cousins, MD [address] [place],  [place] [address]  HISTORICAL INFORMATION:   Selected notes from the MEDICAL RECORD NUMBER Referred by Dr. Jetty Mort at [facility] for retinal hemorrhages OS LEE:  Ocular Hx- PMH-    CURRENT MEDICATIONS: No current outpatient medications on file. (Ophthalmic Drugs)   No current facility-administered medications for this visit. (Ophthalmic Drugs)   Current Outpatient Medications (Other)  Medication Sig   acetaminophen  (TYLENOL ) 325 MG tablet Take 2 tablets (650 mg total) by mouth every 6 (six) hours as needed for mild pain (or temp > 100).   albuterol  (VENTOLIN  HFA) 108 (90 Base) MCG/ACT inhaler Inhale 2 puffs into the lungs every 6 (six) hours as needed for wheezing or shortness of breath.   aspirin  EC 81 MG tablet Take 1 tablet (81 mg total) by mouth daily. Swallow whole.   atorvastatin  (LIPITOR) 20 MG tablet Take 1 tablet (20 mg total) by mouth daily.   cyanocobalamin  (VITAMIN B12) 1000 MCG tablet Take 1,000 mcg by mouth daily. (Patient not taking: Reported on 03/14/2024)   No current facility-administered medications for this visit. (Other)   REVIEW OF  SYSTEMS: ROS   Positive for: Cardiovascular, Eyes Last edited by Alise Appl, COT on 04/23/2024 12:17 PM.       ALLERGIES No Known Allergies  PAST MEDICAL HISTORY Past Medical History:  Diagnosis Date   GERD (gastroesophageal reflux disease)    Past Surgical History:  Procedure Laterality Date   CHOLECYSTECTOMY N/A 01/30/2019   Procedure: LAPAROSCOPIC CHOLECYSTECTOMY;  Surgeon: Joyce Nixon, MD;  Location: [facility];  Service: General;  Laterality: N/A;   INCISION AND DRAINAGE ABSCESS Left 03/31/2016   Procedure: INCISION AND DRAINAGE OF LEFT INNER THIGH ABSCESS;  Surgeon: Lillette Reid III, MD;  Location: [facility];  Service: General;  Laterality: Left;   NECK SURGERY     plate   FAMILY HISTORY Family History  Problem Relation Age of Onset   Cancer Mother 61 - 78       lung   Heart disease Father 22 - 66   Cancer Maternal Grandfather 16 - 79       colon   Heart disease Maternal Grandfather     SOCIAL HISTORY Social History   Tobacco Use   Smoking status: Former    Current packs/day: 0.00    Average packs/day: 4.0 packs/day for 15.0 years (60.0 ttl pk-yrs)    Types: Cigarettes, E-cigarettes    Start date: 01/29/1998    Quit date: 01/29/2013  Years since quitting: 11.2   Smokeless tobacco: Never  Vaping Use   Vaping status: Every Day  Substance Use Topics   Alcohol use: No   Drug use: No       OPHTHALMIC EXAM:  Base Eye Exam     Visual Acuity (Snellen - Linear)       Right Left   Dist cc 20/30 20/150 -1   Dist ph cc NI NI         Tonometry (Tonopen, 12:21 PM)       Right Left   Pressure 18 18  Squeezing         Pupils       Pupils Dark Light Shape React APD   Right PERRL 3 2 Round Brisk None   Left PERRL 3 2 Round Brisk None         Visual Fields       Left Right    Full Full         Extraocular Movement       Right Left    Full, Ortho Full, Ortho         Neuro/Psych     Oriented x3: Yes   Mood/Affect: Normal          Dilation     Both eyes: 2.5% Phenylephrine @ 12:21 PM           Slit Lamp and Fundus Exam     Slit Lamp Exam       Right Left   Lids/Lashes Dermatochalasis - upper lid, mild MGD Dermatochalasis - upper lid, mild MGD   Conjunctiva/Sclera Mild temporal pinguecula, + mucus temporal pinguecula   Cornea 1+ Punctate epithelial erosions, tear film debris, healed epi defect 0330 mid zone arcus, trace PEE   Anterior Chamber deep and clear deep and clear   Iris Round and dilated Round and dilated   Lens 2+ Nuclear sclerosis, 2+ Cortical cataract 2+ Nuclear sclerosis, 2+ Cortical cataract   Anterior Vitreous mild syneresis mild syneresis, Posterior vitreous detachment         Fundus Exam       Right Left   Disc Pink and Sharp +edema, blurred margin, +heme 360 -- improved   C/D Ratio 0.2 0.2   Macula Flat, Good foveal reflex, mild RPE mottling, No heme or edema central edema -- improved, diffuse DBH -- improving, scattered exudates -- improving   Vessels attenuated, mild tortuosity Attenuated arterioles, dilated venules, CRVO   Periphery Attached, focal pigmented CR atrophy at 0430, No heme Attached, scattered DBH greatest temporal periphery -- improving           Refraction     Wearing Rx       Sphere Cylinder Axis Add   Right -1.25 +2.75 177 +2.50   Left -2.50 +3.75 175 +2.50            IMAGING AND PROCEDURES  Imaging and Procedures for 04/23/2024  OCT, Retina - OU - Both Eyes        Right Eye Quality was good. Central Foveal Thickness: 295. Progression has been stable. Findings include normal foveal contour, no IRF, no SRF.   Left Eye Quality was good. Central Foveal Thickness: 285. Progression has improved. Findings include no SRF, abnormal foveal contour, intraretinal hyper-reflective material, intraretinal fluid, macular pucker (interval improvement in central IRF / edema and foveal contour, +vitreous opacities -- stably improved).    Notes  *Images captured and stored on drive  Diagnosis / Impression:  OD: NFP; no IRF/SRF  OS: interval improvement in central IRF / edema and foveal contour, +vitreous opacities -- stably improved  Clinical management:  See below  Abbreviations: NFP - Normal foveal profile. CME - cystoid macular edema. PED - pigment epithelial detachment. IRF - intraretinal fluid. SRF - subretinal fluid. EZ - ellipsoid zone. ERM - epiretinal membrane. ORA - outer retinal atrophy. ORT - outer retinal tubulation. SRHM - subretinal hyper-reflective material. IRHM - intraretinal hyper-reflective material      Intravitreal Injection, Pharmacologic Agent - OS - Left Eye       Time Out 04/23/2024. 12:44 PM. Confirmed correct patient, procedure, site, and patient consented.   Anesthesia Topical anesthesia was used. Anesthetic medications included Lidocaine  2%, Proparacaine 0.5%.   Procedure Preparation included 5% betadine to ocular surface, eyelid speculum. A supplied (32g) needle was used.   Injection: 1.25 mg Bevacizumab  1.25mg /0.65ml   Route: Intravitreal, Site: Left Eye   NDC: [id number], Lot: [id number], Expiration date: 05/23/2024   Post-op Post injection exam found visual acuity of at least counting fingers. The patient tolerated the procedure well. There were no complications. The patient received written and verbal post procedure care education. Post injection medications were not given.            ASSESSMENT/PLAN:   ICD-10-CM   1. Central retinal vein occlusion with macular edema of left eye  H34.8120 OCT, Retina - OU - Both Eyes    Intravitreal Injection, Pharmacologic Agent - OS - Left Eye    Bevacizumab  (AVASTIN ) SOLN 1.25 mg    2. Essential hypertension  I10     3. Hypertensive retinopathy of both eyes  H35.033     4. Combined forms of age-related cataract of both eyes  H25.813      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 OS #1 (09.16.24), #2 (11.04.24), #3 (12.02.24), #4  (12.31.24), #5 (01.28.25) #6(03.13.25), #7 (04.11.25),   - delayed f/u - 6 wks instead of 4 (01.28.25 to 03.13.25)  - pt delayed to follow up from 4 to 7 weeks (11.04.24) - pt delayed to follow up from 6-8 weeks to 11 months (10.30.23-09.16.24) - pt delayed f/u 5+ weeks from 4 weeks 0(04.11.25 -05.21.25) - originally referred by [facility] for retinal hemorrhages OS in October 2023 - originally presented to [facility] for updated glasses prescription -- otherwise asymptomatic - FA 10.30.23 shows Vascular nonperfusion temporal periphery; mild perivascular leakage from venules -- mild, remote RVO - repeat FA (09.16.24) shows staining of disc, mild perivascular leakage temporal periphery and proximal venules -- CRVO OS - exam showed 360 peripheral DBH, some fine IRH in macula OS + dilated and tortuous venules OS - today, BCVA OS 20/150 - stable - OCT shows interval improvement in central IRF / edema and foveal contour, +vitreous opacities -- stably improved at 5+ wks - recommend IVA OS #8 today, 05.21.25 w/ f/u in 5 wks - pt wishes to proceed with injection - RBA of procedure discussed, questions answered - IVA informed consent obtained and signed, 09.16.24 - see procedure note - f/u 5 weeks, DFE, OCT, possible injxn  2,3. Hypertensive retinopathy OU  - BP in office 140-150s / 80-90s at initial visit in 2023 - discussed importance of tight BP control - recommend establishment of PCP and further evaluation and management of BP  4. Mixed Cataract OU - The symptoms of cataract, surgical options, and treatments and risks were discussed with patient. - discussed diagnosis and progression  -  monitor for now  Ophthalmic Meds Ordered this visit:  Meds ordered this encounter  Medications   Bevacizumab  (AVASTIN ) SOLN 1.25 mg     Return in about 5 weeks (around 05/28/2024) for f/u CRVO OS , DFE, OCT, Possible, IVA, OS.  There are no Patient Instructions on file for this visit.  This document  serves as a record of services personally performed by Jeanice Millard, MD, PhD. It was created on their behalf by Angelia Kelp, an ophthalmic technician. The creation of this record is the provider's dictation and/or activities during the visit.    Electronically signed by: Angelia Kelp, OA, 04/23/24  10:15 PM  This document serves as a record of services personally performed by Jeanice Millard, MD, PhD. It was created on their behalf by Olene Berne, COT an ophthalmic technician. The creation of this record is the provider's dictation and/or activities during the visit.    Electronically signed by:  Olene Berne, COT  04/23/24 10:15 PM  Jeanice Millard, M.D., Ph.D. Diseases & Surgery of the Retina and Vitreous [facility] 04/23/2024   I have reviewed the above documentation for accuracy and completeness, and I agree with the above. Jeanice Millard, M.D., Ph.D. 04/23/24 10:17 PM    Abbreviations: M myopia (nearsighted); A astigmatism; H hyperopia (farsighted); P presbyopia; Mrx spectacle prescription;  CTL contact lenses; OD right eye; OS left eye; OU both eyes  XT exotropia; ET esotropia; PEK punctate epithelial keratitis; PEE punctate epithelial erosions; DES dry eye syndrome; MGD meibomian gland dysfunction; ATs artificial tears; PFAT's preservative free artificial tears; NSC nuclear sclerotic cataract; PSC posterior subcapsular cataract; ERM epi-retinal membrane; PVD posterior vitreous detachment; RD retinal detachment; DM diabetes mellitus; DR diabetic retinopathy; NPDR non-proliferative diabetic retinopathy; PDR proliferative diabetic retinopathy; CSME clinically significant macular edema; DME diabetic macular edema; dbh dot blot hemorrhages; CWS cotton wool spot; POAG primary open angle glaucoma; C/D cup-to-disc ratio; HVF humphrey visual field; GVF goldmann visual field; OCT optical coherence tomography; IOP intraocular pressure; BRVO Branch  retinal vein occlusion; CRVO central retinal vein occlusion; CRAO central retinal artery occlusion; BRAO branch retinal artery occlusion; RT retinal tear; SB scleral buckle; PPV pars plana vitrectomy; VH Vitreous hemorrhage; PRP panretinal laser photocoagulation; IVK intravitreal kenalog; VMT vitreomacular traction; MH Macular hole;  NVD neovascularization of the disc; NVE neovascularization elsewhere; AREDS age related eye disease study; ARMD age related macular degeneration; POAG primary open angle glaucoma; EBMD epithelial/anterior basement membrane dystrophy; ACIOL anterior chamber intraocular lens; IOL intraocular lens; PCIOL posterior chamber intraocular lens; Phaco/IOL phacoemulsification with intraocular lens placement; PRK photorefractive keratectomy; LASIK laser assisted in situ keratomileusis; HTN hypertension; DM diabetes mellitus; COPD chronic obstructive pulmonary disease

## 2024-04-23 ENCOUNTER — Ambulatory Visit (INDEPENDENT_AMBULATORY_CARE_PROVIDER_SITE_OTHER): Admitting: Ophthalmology

## 2024-04-23 ENCOUNTER — Encounter (INDEPENDENT_AMBULATORY_CARE_PROVIDER_SITE_OTHER): Payer: Self-pay | Admitting: Ophthalmology

## 2024-04-23 DIAGNOSIS — I10 Essential (primary) hypertension: Secondary | ICD-10-CM | POA: Diagnosis not present

## 2024-04-23 DIAGNOSIS — H35033 Hypertensive retinopathy, bilateral: Secondary | ICD-10-CM

## 2024-04-23 DIAGNOSIS — H34812 Central retinal vein occlusion, left eye, with macular edema: Secondary | ICD-10-CM

## 2024-04-23 DIAGNOSIS — H25813 Combined forms of age-related cataract, bilateral: Secondary | ICD-10-CM | POA: Diagnosis not present

## 2024-04-23 MED ORDER — BEVACIZUMAB CHEMO INJECTION 1.25MG/0.05ML SYRINGE FOR KALEIDOSCOPE
1.2500 mg | INTRAVITREAL | Status: AC | PRN
  Administered 2024-04-23: 1.25 mg via INTRAVITREAL

## 2024-05-20 NOTE — Progress Notes (Signed)
 [facility] - Clinic Note  05/28/2024     CHIEF COMPLAINT Patient presents for Retina Follow Up   HISTORY OF PRESENT ILLNESS: Alan Goodman is a 58 y.o. male who presents to the clinic today for:   HPI     Retina Follow Up   Patient presents with  CRVO/BRVO.  In left eye.  This started 5 weeks ago.  I, the attending physician,  performed the HPI with the patient and updated documentation appropriately.        Comments   Patient here for 5 weeks retina follow up for CRVO OS. Patient states vision about the same. No eye pain.      Last edited by Valdemar Rogue, MD on 05/28/2024 10:52 PM.    Patient states he is wearing new glasses today  Referring physician: Wendolyn Jenkins Jansky, MD [address] [place],  [place] [address]  HISTORICAL INFORMATION:   Selected notes from the MEDICAL RECORD NUMBER Referred by Dr. Lawence at [facility], [facility] for retinal hemorrhages OS LEE:  Ocular Hx- PMH-    CURRENT MEDICATIONS: No current outpatient medications on file. (Ophthalmic Drugs)   No current facility-administered medications for this visit. (Ophthalmic Drugs)   Current Outpatient Medications (Other)  Medication Sig   acetaminophen  (TYLENOL ) 325 MG tablet Take 2 tablets (650 mg total) by mouth every 6 (six) hours as needed for mild pain (or temp > 100).   albuterol  (VENTOLIN  HFA) 108 (90 Base) MCG/ACT inhaler Inhale 2 puffs into the lungs every 6 (six) hours as needed for wheezing or shortness of breath.   aspirin  EC 81 MG tablet Take 1 tablet (81 mg total) by mouth daily. Swallow whole.   atorvastatin  (LIPITOR) 20 MG tablet Take 1 tablet (20 mg total) by mouth daily.   cyanocobalamin  (VITAMIN B12) 1000 MCG tablet Take 1,000 mcg by mouth daily. (Patient not taking: Reported on 05/28/2024)   No current facility-administered medications for this visit. (Other)   REVIEW OF SYSTEMS: ROS   Positive for: Cardiovascular, Eyes Last edited by Orval Asberry RAMAN, COA on 05/28/2024 12:31 PM.     ALLERGIES No Known Allergies  PAST MEDICAL HISTORY Past Medical History:  Diagnosis Date   GERD (gastroesophageal reflux disease)    Past Surgical History:  Procedure Laterality Date   CHOLECYSTECTOMY N/A 01/30/2019   Procedure: LAPAROSCOPIC CHOLECYSTECTOMY;  Surgeon: Debby Hila, MD;  Location: [facility];  Service: General;  Laterality: N/A;   INCISION AND DRAINAGE ABSCESS Left 03/31/2016   Procedure: INCISION AND DRAINAGE OF LEFT INNER THIGH ABSCESS;  Surgeon: Deward Null III, MD;  Location: [facility];  Service: General;  Laterality: Left;   NECK SURGERY     plate   FAMILY HISTORY Family History  Problem Relation Age of Onset   Cancer Mother 94 - 52       lung   Heart disease Father 80 - 40   Cancer Maternal Grandfather 68 - 79       colon   Heart disease Maternal Grandfather     SOCIAL HISTORY Social History   Tobacco Use   Smoking status: Former    Current packs/day: 0.00    Average packs/day: 4.0 packs/day for 15.0 years (60.0 ttl pk-yrs)    Types: Cigarettes, E-cigarettes    Start date: 01/29/1998    Quit date: 01/29/2013    Years since quitting: 11.3   Smokeless tobacco: Never  Vaping Use   Vaping status: Every Day  Substance Use Topics  Alcohol use: No   Drug use: No       OPHTHALMIC EXAM:  Base Eye Exam     Visual Acuity (Snellen - Linear)       Right Left   Dist cc 20/20 -1 20/150   Dist ph cc  NI    Correction: Glasses         Tonometry (Tonopen, 12:29 PM)       Right Left   Pressure 16 17         Pupils       Dark Light Shape React APD   Right 3 2 Round Brisk None   Left 3 2 Round Brisk None         Visual Fields (Counting fingers)       Left Right    Full Full         Extraocular Movement       Right Left    Full, Ortho Full, Ortho         Neuro/Psych     Oriented x3: Yes   Mood/Affect: Normal         Dilation     Both eyes: 1.0% Mydriacyl, 2.5% Phenylephrine @  12:29 PM           Slit Lamp and Fundus Exam     Slit Lamp Exam       Right Left   Lids/Lashes Dermatochalasis - upper lid, mild MGD Dermatochalasis - upper lid, mild MGD   Conjunctiva/Sclera Mild temporal pinguecula, + mucus temporal pinguecula   Cornea 1+ Punctate epithelial erosions, tear film debris, healed epi defect 0330 mid zone arcus, trace PEE   Anterior Chamber deep and clear deep and clear   Iris Round and dilated Round and dilated   Lens 2-3+ Nuclear sclerosis, 2-3+ Cortical cataract 2-3+ Nuclear sclerosis, 2-3+ Cortical cataract   Anterior Vitreous mild syneresis mild syneresis, Posterior vitreous detachment         Fundus Exam       Right Left   Disc Pink and Sharp +edema and blurred margin -- improved, +heme 360 -- improved, +hyperemia   C/D Ratio 0.2 0.2   Macula Flat, Good foveal reflex, mild RPE mottling, No heme or edema central edema -- stably improved, diffuse DBH / exudates -- improved   Vessels attenuated, mild tortuosity Attenuated arterioles, dilated venules, CRVO   Periphery Attached, focal pigmented CR atrophy at 0430, No heme Attached, scattered DBH greatest temporal periphery -- improving           Refraction     Wearing Rx       Sphere Cylinder Axis Add   Right -1.25 +2.75 177 +2.50   Left -2.50 +3.75 175 +2.50            IMAGING AND PROCEDURES  Imaging and Procedures for 05/28/2024  OCT, Retina - OU - Both Eyes       Right Eye Quality was good. Central Foveal Thickness: 289. Progression has been stable. Findings include normal foveal contour, no IRF, no SRF.   Left Eye Quality was good. Central Foveal Thickness: 261. Progression has been stable. Findings include no SRF, abnormal foveal contour, intraretinal hyper-reflective material, intraretinal fluid, macular pucker (Persistent central IRF / edema, +vitreous opacities -- stably improved).   Notes *Images captured and stored on drive  Diagnosis / Impression:  OD: NFP;  no IRF/SRF  OS: Persistent central IRF / edema, +vitreous opacities -- stably improved  Clinical management:  See below  Abbreviations: NFP - Normal foveal profile. CME - cystoid macular edema. PED - pigment epithelial detachment. IRF - intraretinal fluid. SRF - subretinal fluid. EZ - ellipsoid zone. ERM - epiretinal membrane. ORA - outer retinal atrophy. ORT - outer retinal tubulation. SRHM - subretinal hyper-reflective material. IRHM - intraretinal hyper-reflective material      Intravitreal Injection, Pharmacologic Agent - OS - Left Eye       Time Out 05/28/2024. 1:17 PM. Confirmed correct patient, procedure, site, and patient consented.   Anesthesia Topical anesthesia was used. Anesthetic medications included Lidocaine  2%, Proparacaine 0.5%.   Procedure Preparation included 5% betadine to ocular surface, eyelid speculum. A supplied (32g) needle was used.   Injection: 1.25 mg Bevacizumab  1.25mg /0.87ml   Route: Intravitreal, Site: Left Eye   NDC: [id number], Lot: [id number], Expiration date: 09/01/2024   Post-op Post injection exam found visual acuity of at least counting fingers. The patient tolerated the procedure well. There were no complications. The patient received written and verbal post procedure care education. Post injection medications were not given.            ASSESSMENT/PLAN:   ICD-10-CM   1. Central retinal vein occlusion with macular edema of left eye  H34.8120 OCT, Retina - OU - Both Eyes    Intravitreal Injection, Pharmacologic Agent - OS - Left Eye    Bevacizumab  (AVASTIN ) SOLN 1.25 mg    2. Essential hypertension  I10     3. Hypertensive retinopathy of both eyes  H35.033     4. Combined forms of age-related cataract of both eyes  H25.813      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 OS #1 (09.16.24), #2 (11.04.24), #3 (12.02.24), #4 (12.31.24), #5 (01.28.25) #6(03.13.25), #7 (04.11.25), #8 (05.21.25)  - delayed f/u - 6 wks instead of 4 (01.28.25 to  03.13.25)  - pt delayed to follow up from 4 to 7 weeks (11.04.24) - pt delayed to follow up from 6-8 weeks to 11 months (10.30.23-09.16.24) - pt delayed f/u 5+ weeks from 4 weeks 0(04.11.25 -05.21.25) - originally referred by [facility] for retinal hemorrhages OS in October 2023 - originally presented to [facility] for updated glasses prescription -- otherwise asymptomatic - FA 10.30.23 shows Vascular nonperfusion temporal periphery; mild perivascular leakage from venules -- mild, remote RVO - repeat FA (09.16.24) shows staining of disc, mild perivascular leakage temporal periphery and proximal venules -- CRVO OS - exam showed 360 peripheral DBH, some fine IRH in macula OS + dilated and tortuous venules OS - today, BCVA OS 20/150 - stable - OCT shows Persistent central IRF / edema, +vitreous opacities -- stably improved at 5 wks - recommend IVA OS #9 today, 06.25.25 w/ f/u in 5 wks - pt wishes to proceed with injection - RBA of procedure discussed, questions answered - IVA informed consent obtained and signed, 09.16.24 - see procedure note - f/u 5 weeks, DFE, OCT, possible injxn  2,3. Hypertensive retinopathy OU  - BP in office 140-150s / 80-90s at initial visit in 2023 - discussed importance of tight BP control - recommend establishment of PCP and further evaluation and management of BP  4. Mixed Cataract OU - The symptoms of cataract, surgical options, and treatments and risks were discussed with patient. - discussed diagnosis and progression  - monitor for now  Ophthalmic Meds Ordered this visit:  Meds ordered this encounter  Medications   Bevacizumab  (AVASTIN ) SOLN 1.25 mg     Return in about 5 weeks (  around 07/02/2024) for f/u CRVO OS, DFE, OCT, Possible Injxn.  There are no Patient Instructions on file for this visit.  This document serves as a record of services personally performed by Redell JUDITHANN Hans, MD, PhD. It was created on their behalf by Almetta Pesa, an  ophthalmic technician. The creation of this record is the provider's dictation and/or activities during the visit.    Electronically signed by: Almetta Pesa, OA, 05/28/24  10:53 PM  This document serves as a record of services personally performed by Redell JUDITHANN Hans, MD, PhD. It was created on their behalf by Alan PARAS. Delores, OA an ophthalmic technician. The creation of this record is the provider's dictation and/or activities during the visit.    Electronically signed by: Alan PARAS. Delores, OA 05/28/24 10:53 PM   Redell JUDITHANN Hans, M.D., Ph.D. Diseases & Surgery of the Retina and Vitreous [facility] 05/28/2024   I have reviewed the above documentation for accuracy and completeness, and I agree with the above. Redell JUDITHANN Hans, M.D., Ph.D. 05/28/24 10:54 PM   Abbreviations: M myopia (nearsighted); A astigmatism; H hyperopia (farsighted); P presbyopia; Mrx spectacle prescription;  CTL contact lenses; OD right eye; OS left eye; OU both eyes  XT exotropia; ET esotropia; PEK punctate epithelial keratitis; PEE punctate epithelial erosions; DES dry eye syndrome; MGD meibomian gland dysfunction; ATs artificial tears; PFAT's preservative free artificial tears; NSC nuclear sclerotic cataract; PSC posterior subcapsular cataract; ERM epi-retinal membrane; PVD posterior vitreous detachment; RD retinal detachment; DM diabetes mellitus; DR diabetic retinopathy; NPDR non-proliferative diabetic retinopathy; PDR proliferative diabetic retinopathy; CSME clinically significant macular edema; DME diabetic macular edema; dbh dot blot hemorrhages; CWS cotton wool spot; POAG primary open angle glaucoma; C/D cup-to-disc ratio; HVF humphrey visual field; GVF goldmann visual field; OCT optical coherence tomography; IOP intraocular pressure; BRVO Branch retinal vein occlusion; CRVO central retinal vein occlusion; CRAO central retinal artery occlusion; BRAO branch retinal artery occlusion; RT retinal  tear; SB scleral buckle; PPV pars plana vitrectomy; VH Vitreous hemorrhage; PRP panretinal laser photocoagulation; IVK intravitreal kenalog; VMT vitreomacular traction; MH Macular hole;  NVD neovascularization of the disc; NVE neovascularization elsewhere; AREDS age related eye disease study; ARMD age related macular degeneration; POAG primary open angle glaucoma; EBMD epithelial/anterior basement membrane dystrophy; ACIOL anterior chamber intraocular lens; IOL intraocular lens; PCIOL posterior chamber intraocular lens; Phaco/IOL phacoemulsification with intraocular lens placement; PRK photorefractive keratectomy; LASIK laser assisted in situ keratomileusis; HTN hypertension; DM diabetes mellitus; COPD chronic obstructive pulmonary disease

## 2024-05-28 ENCOUNTER — Encounter (INDEPENDENT_AMBULATORY_CARE_PROVIDER_SITE_OTHER): Payer: Self-pay | Admitting: Ophthalmology

## 2024-05-28 ENCOUNTER — Ambulatory Visit (INDEPENDENT_AMBULATORY_CARE_PROVIDER_SITE_OTHER): Admitting: Ophthalmology

## 2024-05-28 DIAGNOSIS — H34812 Central retinal vein occlusion, left eye, with macular edema: Secondary | ICD-10-CM

## 2024-05-28 DIAGNOSIS — H35033 Hypertensive retinopathy, bilateral: Secondary | ICD-10-CM

## 2024-05-28 DIAGNOSIS — I10 Essential (primary) hypertension: Secondary | ICD-10-CM | POA: Diagnosis not present

## 2024-05-28 DIAGNOSIS — H25813 Combined forms of age-related cataract, bilateral: Secondary | ICD-10-CM

## 2024-05-28 MED ORDER — BEVACIZUMAB CHEMO INJECTION 1.25MG/0.05ML SYRINGE FOR KALEIDOSCOPE
1.2500 mg | INTRAVITREAL | Status: AC | PRN
  Administered 2024-05-28: 1.25 mg via INTRAVITREAL

## 2024-06-26 NOTE — Progress Notes (Shared)
 [facility] - Clinic Note  07/02/2024     CHIEF COMPLAINT Patient presents for No chief complaint on file.   HISTORY OF PRESENT ILLNESS: Alan Goodman is a 58 y.o. male who presents to the clinic today for:    Patient states   Referring physician: Wendolyn Jenkins Jansky, MD [address] [place],  [place] [address]  HISTORICAL INFORMATION:   Selected notes from the MEDICAL RECORD NUMBER Referred by Dr. Lawence at [facility], [facility] for retinal hemorrhages OS LEE:  Ocular Hx- PMH-    CURRENT MEDICATIONS: No current outpatient medications on file. (Ophthalmic Drugs)   No current facility-administered medications for this visit. (Ophthalmic Drugs)   Current Outpatient Medications (Other)  Medication Sig   acetaminophen  (TYLENOL ) 325 MG tablet Take 2 tablets (650 mg total) by mouth every 6 (six) hours as needed for mild pain (or temp > 100).   albuterol  (VENTOLIN  HFA) 108 (90 Base) MCG/ACT inhaler Inhale 2 puffs into the lungs every 6 (six) hours as needed for wheezing or shortness of breath.   aspirin  EC 81 MG tablet Take 1 tablet (81 mg total) by mouth daily. Swallow whole.   atorvastatin  (LIPITOR) 20 MG tablet Take 1 tablet (20 mg total) by mouth daily.   cyanocobalamin  (VITAMIN B12) 1000 MCG tablet Take 1,000 mcg by mouth daily. (Patient not taking: Reported on 05/28/2024)   No current facility-administered medications for this visit. (Other)   REVIEW OF SYSTEMS:   ALLERGIES No Known Allergies  PAST MEDICAL HISTORY Past Medical History:  Diagnosis Date   GERD (gastroesophageal reflux disease)    Past Surgical History:  Procedure Laterality Date   CHOLECYSTECTOMY N/A 01/30/2019   Procedure: LAPAROSCOPIC CHOLECYSTECTOMY;  Surgeon: Debby Hila, MD;  Location: [facility];  Service: General;  Laterality: N/A;   INCISION AND DRAINAGE ABSCESS Left 03/31/2016   Procedure: INCISION AND DRAINAGE OF LEFT INNER THIGH ABSCESS;  Surgeon: Deward Null III,  MD;  Location: [facility];  Service: General;  Laterality: Left;   NECK SURGERY     plate   FAMILY HISTORY Family History  Problem Relation Age of Onset   Cancer Mother 66 - 41       lung   Heart disease Father 69 - 22   Cancer Maternal Grandfather 58 - 79       colon   Heart disease Maternal Grandfather     SOCIAL HISTORY Social History   Tobacco Use   Smoking status: Former    Current packs/day: 0.00    Average packs/day: 4.0 packs/day for 15.0 years (60.0 ttl pk-yrs)    Types: Cigarettes, E-cigarettes    Start date: 01/29/1998    Quit date: 01/29/2013    Years since quitting: 11.4   Smokeless tobacco: Never  Vaping Use   Vaping status: Every Day  Substance Use Topics   Alcohol use: No   Drug use: No       OPHTHALMIC EXAM:  Not recorded     IMAGING AND PROCEDURES  Imaging and Procedures for 07/02/2024          ASSESSMENT/PLAN: No diagnosis found.  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 OS #1 (09.16.24), #2 (11.04.24), #3 (12.02.24), #4 (12.31.24), #5 (01.28.25) #6(03.13.25), #7 (04.11.25), #8 (05.21.25), #9 (06.25.25)  - delayed f/u - 6 wks instead of 4 (01.28.25 to 03.13.25)  - pt delayed to follow up from 4 to 7 weeks (11.04.24) - pt delayed to follow up from 6-8 weeks to  11 months (10.30.23-09.16.24) - pt delayed f/u 5+ weeks from 4 weeks 0(04.11.25 -05.21.25) - originally referred by [facility] for retinal hemorrhages OS in October 2023 - originally presented to [facility] for updated glasses prescription -- otherwise asymptomatic - FA 10.30.23 shows Vascular nonperfusion temporal periphery; mild perivascular leakage from venules -- mild, remote RVO - repeat FA (09.16.24) shows staining of disc, mild perivascular leakage temporal periphery and proximal venules -- CRVO OS - exam showed 360 peripheral DBH, some fine IRH in macula OS + dilated and tortuous venules OS - today, BCVA OS 20/150 - stable - OCT shows Persistent central IRF / edema, +vitreous  opacities -- stably improved at 5 wks - recommend IVA OS #10 today, 07.30.25 - RBA of procedure discussed, questions answered - IVA informed consent obtained and signed, 09.16.24 - see procedure note - f/u 5 weeks, DFE, OCT, possible injxn  2,3. Hypertensive retinopathy OU  - BP in office 140-150s / 80-90s at initial visit in 2023 - discussed importance of tight BP control - recommend establishment of PCP and further evaluation and management of BP  4. Mixed Cataract OU - The symptoms of cataract, surgical options, and treatments and risks were discussed with patient. - discussed diagnosis and progression  - monitor for now  Ophthalmic Meds Ordered this visit:  No orders of the defined types were placed in this encounter.    No follow-ups on file.  There are no Patient Instructions on file for this visit.  This document serves as a record of services personally performed by Redell JUDITHANN Hans, MD, PhD. It was created on their behalf by Almetta Pesa, an ophthalmic technician. The creation of this record is the provider's dictation and/or activities during the visit.    Electronically signed by: Almetta Pesa, OA, 06/26/24  1:07 PM     Redell JUDITHANN Hans, M.D., Ph.D. Diseases & Surgery of the Retina and Vitreous [facility] 05/28/2024    Abbreviations: M myopia (nearsighted); A astigmatism; H hyperopia (farsighted); P presbyopia; Mrx spectacle prescription;  CTL contact lenses; OD right eye; OS left eye; OU both eyes  XT exotropia; ET esotropia; PEK punctate epithelial keratitis; PEE punctate epithelial erosions; DES dry eye syndrome; MGD meibomian gland dysfunction; ATs artificial tears; PFAT's preservative free artificial tears; NSC nuclear sclerotic cataract; PSC posterior subcapsular cataract; ERM epi-retinal membrane; PVD posterior vitreous detachment; RD retinal detachment; DM diabetes mellitus; DR diabetic retinopathy; NPDR non-proliferative  diabetic retinopathy; PDR proliferative diabetic retinopathy; CSME clinically significant macular edema; DME diabetic macular edema; dbh dot blot hemorrhages; CWS cotton wool spot; POAG primary open angle glaucoma; C/D cup-to-disc ratio; HVF humphrey visual field; GVF goldmann visual field; OCT optical coherence tomography; IOP intraocular pressure; BRVO Branch retinal vein occlusion; CRVO central retinal vein occlusion; CRAO central retinal artery occlusion; BRAO branch retinal artery occlusion; RT retinal tear; SB scleral buckle; PPV pars plana vitrectomy; VH Vitreous hemorrhage; PRP panretinal laser photocoagulation; IVK intravitreal kenalog; VMT vitreomacular traction; MH Macular hole;  NVD neovascularization of the disc; NVE neovascularization elsewhere; AREDS age related eye disease study; ARMD age related macular degeneration; POAG primary open angle glaucoma; EBMD epithelial/anterior basement membrane dystrophy; ACIOL anterior chamber intraocular lens; IOL intraocular lens; PCIOL posterior chamber intraocular lens; Phaco/IOL phacoemulsification with intraocular lens placement; PRK photorefractive keratectomy; LASIK laser assisted in situ keratomileusis; HTN hypertension; DM diabetes mellitus; COPD chronic obstructive pulmonary disease

## 2024-07-02 ENCOUNTER — Encounter (INDEPENDENT_AMBULATORY_CARE_PROVIDER_SITE_OTHER): Admitting: Ophthalmology

## 2024-07-02 DIAGNOSIS — H34812 Central retinal vein occlusion, left eye, with macular edema: Secondary | ICD-10-CM

## 2024-07-02 DIAGNOSIS — I10 Essential (primary) hypertension: Secondary | ICD-10-CM

## 2024-07-02 DIAGNOSIS — H25813 Combined forms of age-related cataract, bilateral: Secondary | ICD-10-CM

## 2024-07-02 DIAGNOSIS — H35033 Hypertensive retinopathy, bilateral: Secondary | ICD-10-CM

## 2024-08-13 NOTE — Progress Notes (Shared)
 [facility] - Clinic Note  08/15/2024     CHIEF COMPLAINT Patient presents for No chief complaint on file.   HISTORY OF PRESENT ILLNESS: Alan Goodman is a 58 y.o. male who presents to the clinic today for:    Patient states he is wearing new glasses today  Referring physician: Wendolyn Jenkins Jansky, MD [address] [place],  [place] [address]  HISTORICAL INFORMATION:   Selected notes from the MEDICAL RECORD NUMBER Referred by Dr. Lawence at [facility], [facility] for retinal hemorrhages OS LEE:  Ocular Hx- PMH-    CURRENT MEDICATIONS: No current outpatient medications on file. (Ophthalmic Drugs)   No current facility-administered medications for this visit. (Ophthalmic Drugs)   Current Outpatient Medications (Other)  Medication Sig   acetaminophen  (TYLENOL ) 325 MG tablet Take 2 tablets (650 mg total) by mouth every 6 (six) hours as needed for mild pain (or temp > 100).   albuterol  (VENTOLIN  HFA) 108 (90 Base) MCG/ACT inhaler Inhale 2 puffs into the lungs every 6 (six) hours as needed for wheezing or shortness of breath.   aspirin  EC 81 MG tablet Take 1 tablet (81 mg total) by mouth daily. Swallow whole.   atorvastatin  (LIPITOR) 20 MG tablet Take 1 tablet (20 mg total) by mouth daily.   cyanocobalamin  (VITAMIN B12) 1000 MCG tablet Take 1,000 mcg by mouth daily. (Patient not taking: Reported on 05/28/2024)   No current facility-administered medications for this visit. (Other)   REVIEW OF SYSTEMS:   ALLERGIES No Known Allergies  PAST MEDICAL HISTORY Past Medical History:  Diagnosis Date   GERD (gastroesophageal reflux disease)    Past Surgical History:  Procedure Laterality Date   CHOLECYSTECTOMY N/A 01/30/2019   Procedure: LAPAROSCOPIC CHOLECYSTECTOMY;  Surgeon: Debby Hila, MD;  Location: [facility];  Service: General;  Laterality: N/A;   INCISION AND DRAINAGE ABSCESS Left 03/31/2016   Procedure: INCISION AND DRAINAGE OF LEFT INNER THIGH  ABSCESS;  Surgeon: Deward Null III, MD;  Location: [facility];  Service: General;  Laterality: Left;   NECK SURGERY     plate   FAMILY HISTORY Family History  Problem Relation Age of Onset   Cancer Mother 22 - 61       lung   Heart disease Father 48 - 60   Cancer Maternal Grandfather 26 - 79       colon   Heart disease Maternal Grandfather     SOCIAL HISTORY Social History   Tobacco Use   Smoking status: Former    Current packs/day: 0.00    Average packs/day: 4.0 packs/day for 15.0 years (60.0 ttl pk-yrs)    Types: Cigarettes, E-cigarettes    Start date: 01/29/1998    Quit date: 01/29/2013    Years since quitting: 11.5   Smokeless tobacco: Never  Vaping Use   Vaping status: Every Day  Substance Use Topics   Alcohol use: No   Drug use: No       OPHTHALMIC EXAM:  Not recorded     IMAGING AND PROCEDURES  Imaging and Procedures for 08/15/2024          ASSESSMENT/PLAN: No diagnosis found.  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 OS #1 (09.16.24), #2 (11.04.24), #3 (12.02.24), #4 (12.31.24), #5 (01.28.25) #6(03.13.25), #7 (04.11.25), #8 (05.21.25) #9(06.25.25)  - delayed f/u - 6 wks instead of 4 (01.28.25 to 03.13.25)  - pt delayed to follow up from 4 to 7 weeks (11.04.24) - pt delayed to follow up  from 6-8 weeks to 11 months (10.30.23-09.16.24) - pt delayed f/u 5+ weeks from 4 weeks 0(04.11.25 -05.21.25) - originally referred by [facility] for retinal hemorrhages OS in October 2023 - originally presented to [facility] for updated glasses prescription -- otherwise asymptomatic - FA 10.30.23 shows Vascular nonperfusion temporal periphery; mild perivascular leakage from venules -- mild, remote RVO - repeat FA (09.16.24) shows staining of disc, mild perivascular leakage temporal periphery and proximal venules -- CRVO OS - exam showed 360 peripheral DBH, some fine IRH in macula OS + dilated and tortuous venules OS - today, BCVA OS 20/150 - stable - OCT shows Persistent  central IRF / edema, +vitreous opacities -- stably improved at 5 wks - recommend IVA OS #10 today, 09.12..25 w/ f/u in 5 wks - pt wishes to proceed with injection - RBA of procedure discussed, questions answered - IVA informed consent obtained and signed, 09.16.24 - see procedure note - f/u 5 weeks, DFE, OCT, possible injxn  2,3. Hypertensive retinopathy OU  - BP in office 140-150s / 80-90s at initial visit in 2023 - discussed importance of tight BP control - recommend establishment of PCP and further evaluation and management of BP  4. Mixed Cataract OU - The symptoms of cataract, surgical options, and treatments and risks were discussed with patient. - discussed diagnosis and progression  - monitor for now  Ophthalmic Meds Ordered this visit:  No orders of the defined types were placed in this encounter.    No follow-ups on file.  There are no Patient Instructions on file for this visit.  This document serves as a record of services personally performed by Redell JUDITHANN Hans, MD, PhD. It was created on their behalf by Delon Newness COT, an ophthalmic technician. The creation of this record is the provider's dictation and/or activities during the visit.    Electronically signed by: Delon Newness COT 09.10.2025 10:11 AM   Abbreviations: M myopia (nearsighted); A astigmatism; H hyperopia (farsighted); P presbyopia; Mrx spectacle prescription;  CTL contact lenses; OD right eye; OS left eye; OU both eyes  XT exotropia; ET esotropia; PEK punctate epithelial keratitis; PEE punctate epithelial erosions; DES dry eye syndrome; MGD meibomian gland dysfunction; ATs artificial tears; PFAT's preservative free artificial tears; NSC nuclear sclerotic cataract; PSC posterior subcapsular cataract; ERM epi-retinal membrane; PVD posterior vitreous detachment; RD retinal detachment; DM diabetes mellitus; DR diabetic retinopathy; NPDR non-proliferative diabetic retinopathy; PDR proliferative  diabetic retinopathy; CSME clinically significant macular edema; DME diabetic macular edema; dbh dot blot hemorrhages; CWS cotton wool spot; POAG primary open angle glaucoma; C/D cup-to-disc ratio; HVF humphrey visual field; GVF goldmann visual field; OCT optical coherence tomography; IOP intraocular pressure; BRVO Branch retinal vein occlusion; CRVO central retinal vein occlusion; CRAO central retinal artery occlusion; BRAO branch retinal artery occlusion; RT retinal tear; SB scleral buckle; PPV pars plana vitrectomy; VH Vitreous hemorrhage; PRP panretinal laser photocoagulation; IVK intravitreal kenalog; VMT vitreomacular traction; MH Macular hole;  NVD neovascularization of the disc; NVE neovascularization elsewhere; AREDS age related eye disease study; ARMD age related macular degeneration; POAG primary open angle glaucoma; EBMD epithelial/anterior basement membrane dystrophy; ACIOL anterior chamber intraocular lens; IOL intraocular lens; PCIOL posterior chamber intraocular lens; Phaco/IOL phacoemulsification with intraocular lens placement; PRK photorefractive keratectomy; LASIK laser assisted in situ keratomileusis; HTN hypertension; DM diabetes mellitus; COPD chronic obstructive pulmonary disease

## 2024-08-15 ENCOUNTER — Encounter (INDEPENDENT_AMBULATORY_CARE_PROVIDER_SITE_OTHER): Admitting: Ophthalmology

## 2024-08-15 DIAGNOSIS — H25813 Combined forms of age-related cataract, bilateral: Secondary | ICD-10-CM

## 2024-08-15 DIAGNOSIS — H35033 Hypertensive retinopathy, bilateral: Secondary | ICD-10-CM

## 2024-08-15 DIAGNOSIS — I10 Essential (primary) hypertension: Secondary | ICD-10-CM

## 2024-08-15 DIAGNOSIS — H34812 Central retinal vein occlusion, left eye, with macular edema: Secondary | ICD-10-CM
